# Patient Record
Sex: Female | Born: 1955 | Race: Black or African American | Hispanic: No | Marital: Single | Smoking: Never smoker
Health system: Southern US, Community
[De-identification: ages and names within clinical notes are randomized; demographics above are authoritative.]

## PROBLEM LIST (undated history)

## (undated) DIAGNOSIS — Z87442 Personal history of urinary calculi: Secondary | ICD-10-CM

## (undated) DIAGNOSIS — H269 Unspecified cataract: Secondary | ICD-10-CM

## (undated) DIAGNOSIS — D649 Anemia, unspecified: Secondary | ICD-10-CM

## (undated) DIAGNOSIS — K219 Gastro-esophageal reflux disease without esophagitis: Secondary | ICD-10-CM

## (undated) DIAGNOSIS — R519 Headache, unspecified: Secondary | ICD-10-CM

## (undated) DIAGNOSIS — R51 Headache: Secondary | ICD-10-CM

## (undated) DIAGNOSIS — I209 Angina pectoris, unspecified: Secondary | ICD-10-CM

## (undated) DIAGNOSIS — R0600 Dyspnea, unspecified: Secondary | ICD-10-CM

## (undated) HISTORY — PX: WISDOM TOOTH EXTRACTION: SHX21

## (undated) HISTORY — PX: ABDOMINAL HYSTERECTOMY: SHX81

## (undated) HISTORY — PX: COLPOSCOPY: SHX161

## (undated) HISTORY — PX: CHOLECYSTECTOMY: SHX55

## (undated) HISTORY — DX: Unspecified cataract: H26.9

## (undated) HISTORY — PX: BREAST EXCISIONAL BIOPSY: SUR124

---

## 1998-10-10 ENCOUNTER — Other Ambulatory Visit: Admission: RE | Admit: 1998-10-10 | Discharge: 1998-10-10 | Payer: Self-pay | Admitting: Gynecology

## 1999-10-01 ENCOUNTER — Encounter: Admission: RE | Admit: 1999-10-01 | Discharge: 1999-10-01 | Payer: Self-pay | Admitting: Gynecology

## 1999-10-01 ENCOUNTER — Encounter: Payer: Self-pay | Admitting: Gynecology

## 1999-10-13 ENCOUNTER — Other Ambulatory Visit: Admission: RE | Admit: 1999-10-13 | Discharge: 1999-10-13 | Payer: Self-pay | Admitting: Gynecology

## 1999-10-29 ENCOUNTER — Encounter: Admission: RE | Admit: 1999-10-29 | Discharge: 1999-10-29 | Payer: Self-pay | Admitting: Geriatric Medicine

## 1999-10-29 ENCOUNTER — Encounter: Payer: Self-pay | Admitting: Geriatric Medicine

## 2000-03-02 ENCOUNTER — Ambulatory Visit (HOSPITAL_COMMUNITY): Admission: RE | Admit: 2000-03-02 | Discharge: 2000-03-02 | Payer: Self-pay | Admitting: Gastroenterology

## 2000-10-03 ENCOUNTER — Encounter: Payer: Self-pay | Admitting: Gynecology

## 2000-10-03 ENCOUNTER — Other Ambulatory Visit: Admission: RE | Admit: 2000-10-03 | Discharge: 2000-10-03 | Payer: Self-pay | Admitting: Gynecology

## 2000-10-03 ENCOUNTER — Encounter: Admission: RE | Admit: 2000-10-03 | Discharge: 2000-10-03 | Payer: Self-pay | Admitting: Gynecology

## 2001-08-25 ENCOUNTER — Encounter: Admission: RE | Admit: 2001-08-25 | Discharge: 2001-08-25 | Payer: Self-pay | Admitting: Geriatric Medicine

## 2001-08-25 ENCOUNTER — Encounter: Payer: Self-pay | Admitting: Geriatric Medicine

## 2001-10-04 ENCOUNTER — Encounter: Payer: Self-pay | Admitting: Gynecology

## 2001-10-04 ENCOUNTER — Encounter: Admission: RE | Admit: 2001-10-04 | Discharge: 2001-10-04 | Payer: Self-pay | Admitting: Gynecology

## 2001-10-10 ENCOUNTER — Other Ambulatory Visit: Admission: RE | Admit: 2001-10-10 | Discharge: 2001-10-10 | Payer: Self-pay | Admitting: Gynecology

## 2002-06-13 ENCOUNTER — Encounter: Admission: RE | Admit: 2002-06-13 | Discharge: 2002-06-13 | Payer: Self-pay | Admitting: Geriatric Medicine

## 2002-06-13 ENCOUNTER — Encounter: Payer: Self-pay | Admitting: Geriatric Medicine

## 2002-10-08 ENCOUNTER — Encounter: Payer: Self-pay | Admitting: Gynecology

## 2002-10-08 ENCOUNTER — Encounter: Admission: RE | Admit: 2002-10-08 | Discharge: 2002-10-08 | Payer: Self-pay | Admitting: Gynecology

## 2002-11-05 ENCOUNTER — Other Ambulatory Visit: Admission: RE | Admit: 2002-11-05 | Discharge: 2002-11-05 | Payer: Self-pay | Admitting: Gynecology

## 2003-10-09 ENCOUNTER — Encounter: Admission: RE | Admit: 2003-10-09 | Discharge: 2003-10-09 | Payer: Self-pay | Admitting: Gynecology

## 2003-11-08 ENCOUNTER — Other Ambulatory Visit: Admission: RE | Admit: 2003-11-08 | Discharge: 2003-11-08 | Payer: Self-pay | Admitting: Gynecology

## 2004-10-16 ENCOUNTER — Encounter: Admission: RE | Admit: 2004-10-16 | Discharge: 2004-10-16 | Payer: Self-pay | Admitting: Gynecology

## 2004-10-26 ENCOUNTER — Encounter: Admission: RE | Admit: 2004-10-26 | Discharge: 2004-10-26 | Payer: Self-pay | Admitting: Gynecology

## 2004-11-09 ENCOUNTER — Other Ambulatory Visit: Admission: RE | Admit: 2004-11-09 | Discharge: 2004-11-09 | Payer: Self-pay | Admitting: Gynecology

## 2005-02-11 ENCOUNTER — Ambulatory Visit (HOSPITAL_COMMUNITY): Admission: RE | Admit: 2005-02-11 | Discharge: 2005-02-11 | Payer: Self-pay | Admitting: General Surgery

## 2005-03-15 ENCOUNTER — Ambulatory Visit (HOSPITAL_COMMUNITY): Admission: RE | Admit: 2005-03-15 | Discharge: 2005-03-15 | Payer: Self-pay | Admitting: Gastroenterology

## 2005-06-29 ENCOUNTER — Ambulatory Visit (HOSPITAL_COMMUNITY): Admission: RE | Admit: 2005-06-29 | Discharge: 2005-06-29 | Payer: Self-pay | Admitting: Gastroenterology

## 2005-08-13 ENCOUNTER — Encounter (INDEPENDENT_AMBULATORY_CARE_PROVIDER_SITE_OTHER): Payer: Self-pay | Admitting: *Deleted

## 2005-08-13 ENCOUNTER — Ambulatory Visit (HOSPITAL_COMMUNITY): Admission: RE | Admit: 2005-08-13 | Discharge: 2005-08-14 | Payer: Self-pay | Admitting: General Surgery

## 2005-11-03 ENCOUNTER — Encounter: Admission: RE | Admit: 2005-11-03 | Discharge: 2005-11-03 | Payer: Self-pay | Admitting: Gynecology

## 2005-11-24 ENCOUNTER — Other Ambulatory Visit: Admission: RE | Admit: 2005-11-24 | Discharge: 2005-11-24 | Payer: Self-pay | Admitting: Gynecology

## 2005-11-25 ENCOUNTER — Emergency Department (HOSPITAL_COMMUNITY): Admission: EM | Admit: 2005-11-25 | Discharge: 2005-11-25 | Payer: Self-pay | Admitting: Emergency Medicine

## 2006-01-27 ENCOUNTER — Encounter: Admission: RE | Admit: 2006-01-27 | Discharge: 2006-01-27 | Payer: Self-pay | Admitting: Geriatric Medicine

## 2006-11-07 ENCOUNTER — Encounter: Admission: RE | Admit: 2006-11-07 | Discharge: 2006-11-07 | Payer: Self-pay | Admitting: Gynecology

## 2006-11-28 ENCOUNTER — Other Ambulatory Visit: Admission: RE | Admit: 2006-11-28 | Discharge: 2006-11-28 | Payer: Self-pay | Admitting: Gynecology

## 2007-11-29 ENCOUNTER — Encounter: Admission: RE | Admit: 2007-11-29 | Discharge: 2007-11-29 | Payer: Self-pay | Admitting: Gynecology

## 2007-12-07 ENCOUNTER — Encounter: Admission: RE | Admit: 2007-12-07 | Discharge: 2007-12-07 | Payer: Self-pay | Admitting: Gynecology

## 2008-11-29 ENCOUNTER — Encounter: Admission: RE | Admit: 2008-11-29 | Discharge: 2008-11-29 | Payer: Self-pay | Admitting: Gynecology

## 2009-12-02 ENCOUNTER — Encounter: Admission: RE | Admit: 2009-12-02 | Discharge: 2009-12-02 | Payer: Self-pay | Admitting: Gynecology

## 2010-10-02 NOTE — Op Note (Signed)
[name], [name]            ACCOUNT NO.:  [id number]   MEDICAL RECORD NO.:  [id number]          PATIENT TYPE:  AMB   LOCATION:  ENDO                         FACILITY:  [facility]   PHYSICIAN:  James L. Malon Kindle., M.D.DATE OF BIRTH:  06-19-1955   DATE OF PROCEDURE:  03/15/2005  DATE OF DISCHARGE:                                 OPERATIVE REPORT   PROCEDURE:  Esophagogastroduodenoscopy.   MEDICATIONS:  Cetacaine spray, fentanyl , Versed 6 mg IV.   INDICATIONS:  The patient is a 55 year old with abdominal pain __________  nature.  Recent gallbladder ultrasound was negative.   DESCRIPTION OF PROCEDURE:  After the procedure had been explained to the  patient, consent obtained.  The patient was in the left lateral decubitus  position.  The Olympus scope was inserted and advanced.  Stomach entered,  pylorus identified and passed.  Duodenum including the bulb and second  portion was unremarkable.  Pyloric channel and antrum and body were normal.  Fundus and cardia were seen on the retroflexed view and were normal.  The GE  junction was widely patent.  The distal esophagus was slightly reddened  consistent with mild esophageal reflux.  There were no ulcerations in the  distal and proximal esophagus, were endoscopically normal.  The scope was  withdrawn.  The patient tolerated the procedure well.   ASSESSMENT:  Gastroesophageal reflux disease.  530.81.  Possibly the source  of her symptoms.   PLAN:  Will continue her on Aciphex.  Give her reflux instructions.  She  will be seen back in the office in three months.  Will proceed with  colonoscopy at this time as planned.           ______________________________  Llana Aliment. Malon Kindle., M.D.     Waldron Session  D:  03/15/2005  T:  03/15/2005  Job:  [id number]   cc:   Hal T. Stoneking, M.D.  Fax: [phone]   Ollen Gross. Vernell Morgans, M.D.  [address]  [place]  [place] [address]

## 2010-10-02 NOTE — Op Note (Signed)
[name], [name]            ACCOUNT NO.:  [id number]   MEDICAL RECORD NO.:  [id number]          PATIENT TYPE:  OIB   LOCATION:  5009                         FACILITY:  [facility]   PHYSICIAN:  Ollen Gross. Vernell Morgans, M.D. DATE OF BIRTH:  09-Feb-1956   DATE OF PROCEDURE:  08/18/2005  DATE OF DISCHARGE:  08/14/2005                                 OPERATIVE REPORT   PREOPERATIVE DIAGNOSIS:  Biliary dyskinesia.   POSTOP DIAGNOSIS:  Biliary dyskinesia.   PROCEDURES:  Laparoscopic cholecystectomy with intraoperative cholangiogram.   SURGEON:  Dr. Carolynne Edouard.   ASSISTANT:  Dr. Jamey Ripa.   ANESTHESIA:  General endotracheal.   PROCEDURE:  After informed consent was obtained, the patient was brought to  the operating room and placed in supine position on the operating table.  After induction of general anesthesia, the patient's abdomen was prepped  with Betadine and draped in usual sterile manner.  The area below the  umbilicus was infiltrated with 4% Marcaine.  A small incision was made with  a 15 blade knife.  This incision was carried down through the subcutaneous  tissue bluntly with a hemostat and [employer] retractors until the linea alba  was identified.  The linea alba was incised with a 15 blade knife and each  side was grasped with Kocher clamps and elevated anteriorly.  The  preperitoneal space was then probed bluntly with a hemostat until the  peritoneum was opened and access was gained to the abdominal cavity.  0  Vicryl pursestring stitch was placed in the fascia around the opening.  Hasson cannula was placed through the opening and anchored in place with the  previously placed Vicryl pursestring stitch.  The abdomen was then  insufflated carbon dioxide without difficulty.  The patient was placed in  head-up position and rotated slightly with right side up.  The laparoscope  was inserted through Hasson cannula and the right upper quadrant was  inspected.  The dome of gallbladder and  liver readily identified.  Next the  epigastric region was infiltrated with 0.25% Marcaine.  A small incision was  made a 15 blade knife and 10 mm port was placed bluntly through this  incision into the abdominal cavity under direct vision.  Sites were then  chosen laterally on the right side of the abdomen for placement of 5 mm  ports.  Each of these areas was infiltrated 0.25% Marcaine.  Small stab  incisions were made with a 15 blade knife and 5 mm ports were placed bluntly  through these incisions into the abdominal cavity under direct vision.  A  blunt grasper was placed through the lateral-most 5 mm port and used to  grasp the dome of gallbladder and elevate it anteriorly and superiorly.  Another blunt grasper was placed through the other 5 mm port and used to  retract on the body and neck of the gallbladder. There were some omental  adhesions to the body of gallbladder and these were taken down by  combination of blunt dissection with the dissector and sharp dissection with  the laparoscopic scissors.  Once this was accomplished, the full length  of  the gallbladder was able to be visualized.  The peritoneal reflection was  then opened at the gallbladder neck using electrocautery and blunt  dissection was then carried out in this area until the gallbladder neck  cystic duct junction was readily identified.  A good window was created.  A  single clip was placed on the gallbladder neck.  The small ductotomy was  made just below the clip.  A 14 gauge Angiocath was then placed  percutaneously through the anterior abdominal wall under direct vision.  A  Reddick cholangiogram catheter placed through the Angiocath and flushed.  The Reddick catheter was then placed within the cystic duct and anchored in  place with the clip and cholangiogram was obtained that showed no filling  defects, good emptying in the duodenum and adequate length on the cystic  duct.  Anchoring clip and catheters  removed from the patient.  Three clips  placed proximally on the cystic duct and duct was divided between the two  sets of clips.  Posterior to this the cystic artery was identified and again  dissected bluntly in a circumferential manner until a good window was  created.  Two clips placed proximally and one distally on the artery and the  artery was divided between the two sets of clips.  Next a laparoscopic hook  cautery device was used to separate the gallbladder from the liver bed.  Prior to completely detaching the gallbladder from liver bed, the liver bed  was inspected and several small bleeding points coagulated with  electrocautery until the area was completely hemostatic, the gallbladder was  then detached the rest of the way from the liver bed without difficulty.  Laparoscopic bag was placed through the epigastric port.  The gallbladder  placed within the bag and the bag was sealed and the abdomen was then  irrigated with copious amounts of saline until the effluent was clear.  The  liver bed was inspected again and found to be completely hemostatic.  The  laparoscope was then moved to the epigastric port.  The gallbladder grasper  was placed through the Hasson cannula and used to grasp the opening of the  bag.  The bag with the gallbladder was then removed through the  infraumbilical port without difficulty with the Hasson cannula.  The fascial  defect was closed with the previously placed Vicryl pursestring stitch as  well as was another interrupted figure-of-eight 0 Vicryl stitch. The rest of  the ports were then removed under direct vision and were found to be  hemostatic.  Gas was allowed to escape.  The skin incisions were all closed  with interrupted 4-0 Monocryl subcuticular stitches.  Benzoin, Steri-Strips  and sterile dressings were applied.  The patient tolerated well.  At the end  of the case, all needle, sponge and instruments counts correct.  The patient was then  awakened and taken to recovery in stable condition.      Ollen Gross. Vernell Morgans, M.D.  Electronically Signed     PST/MEDQ  D:  08/18/2005  T:  08/19/2005  Job:  [id number]

## 2010-10-02 NOTE — Op Note (Signed)
[name], [name]            ACCOUNT NO.:  [id number]   MEDICAL RECORD NO.:  [id number]          PATIENT TYPE:  AMB   LOCATION:  ENDO                         FACILITY:  [facility]   PHYSICIAN:  James L. Malon Kindle., M.D.DATE OF BIRTH:  1956/04/02   DATE OF PROCEDURE:  03/15/2005  DATE OF DISCHARGE:                                 OPERATIVE REPORT   PROCEDURE:  Colonoscopy.   MEDICATIONS:  The patient received a total of fentanyl 100 mcg, Versed 8 mg  for both procedures.   SCOPE:  Pediatric adjustable scope.   INDICATIONS FOR PROCEDURE:  Strong family history of colonic neoplasia.  Father has had multiple polyps and multiple other family members have had  polyps. This is done as a followup.   DESCRIPTION OF PROCEDURE:  The procedure had been explained to the patient,  consent obtained. In the left lateral decubitus position, the Olympus video  colonoscope was inserted, the pediatric adjustable scope was used. The prep  was excellent. We were able to reach the cecum without difficulty. The  ileocecal valve and appendiceal orifice were seen. The scope was withdrawn  and the colon carefully examined on withdrawal. No polyps were seen. There  was no diverticulosis. In the rectum, the patient was seen to have moderate  internal hemorrhoids. The scope was withdrawn. The patient tolerated the  procedure well.   ASSESSMENT:  1.  Strong family history of colonic neoplasia with colonic polyps, negative      colonoscopy at this time. V18.5.  2.  Internal hemorrhoids. 455.0   PLAN:  Will recommend Hemoccult's, repeat colonoscopy in 5 years.           ______________________________  Llana Aliment Malon Kindle., M.D.     Waldron Session  D:  03/15/2005  T:  03/15/2005  Job:  [id number]   cc:   Hal T. Stoneking, M.D.  Fax: [phone]   Ollen Gross. Vernell Morgans, M.D.  [address]  [place]  [place] [address]

## 2010-11-05 ENCOUNTER — Other Ambulatory Visit: Payer: Self-pay | Admitting: Gynecology

## 2010-11-05 DIAGNOSIS — N63 Unspecified lump in unspecified breast: Secondary | ICD-10-CM

## 2010-11-11 ENCOUNTER — Other Ambulatory Visit: Payer: Self-pay

## 2010-12-04 ENCOUNTER — Other Ambulatory Visit: Payer: Self-pay

## 2010-12-09 ENCOUNTER — Other Ambulatory Visit: Payer: Self-pay | Admitting: Gynecology

## 2010-12-09 ENCOUNTER — Ambulatory Visit
Admission: RE | Admit: 2010-12-09 | Discharge: 2010-12-09 | Disposition: A | Discharge status: Home / Self Care | Payer: 59 | Source: Ambulatory Visit | Attending: Gynecology | Admitting: Gynecology

## 2010-12-09 DIAGNOSIS — N63 Unspecified lump in unspecified breast: Secondary | ICD-10-CM

## 2010-12-15 ENCOUNTER — Other Ambulatory Visit: Payer: Self-pay | Admitting: Gynecology

## 2011-12-24 ENCOUNTER — Other Ambulatory Visit: Payer: Self-pay | Admitting: Gynecology

## 2011-12-24 DIAGNOSIS — Z1231 Encounter for screening mammogram for malignant neoplasm of breast: Secondary | ICD-10-CM

## 2012-01-11 ENCOUNTER — Ambulatory Visit
Admission: RE | Admit: 2012-01-11 | Discharge: 2012-01-11 | Disposition: A | Discharge status: Home / Self Care | Payer: Self-pay | Source: Ambulatory Visit | Attending: Gynecology | Admitting: Gynecology

## 2012-01-11 DIAGNOSIS — Z1231 Encounter for screening mammogram for malignant neoplasm of breast: Secondary | ICD-10-CM

## 2012-04-18 ENCOUNTER — Other Ambulatory Visit: Payer: Self-pay | Admitting: Gynecology

## 2013-06-14 ENCOUNTER — Other Ambulatory Visit (HOSPITAL_COMMUNITY): Payer: Self-pay | Admitting: Gynecology

## 2013-06-14 DIAGNOSIS — Z1231 Encounter for screening mammogram for malignant neoplasm of breast: Secondary | ICD-10-CM

## 2013-06-19 ENCOUNTER — Other Ambulatory Visit (HOSPITAL_COMMUNITY): Payer: Self-pay | Admitting: Gynecology

## 2013-06-19 ENCOUNTER — Ambulatory Visit (HOSPITAL_COMMUNITY)
Admission: RE | Admit: 2013-06-19 | Discharge: 2013-06-19 | Disposition: A | Discharge status: Home / Self Care | Payer: Self-pay | Source: Ambulatory Visit | Attending: Gynecology | Admitting: Gynecology

## 2013-06-19 DIAGNOSIS — Z1231 Encounter for screening mammogram for malignant neoplasm of breast: Secondary | ICD-10-CM

## 2016-07-01 ENCOUNTER — Other Ambulatory Visit: Payer: Self-pay | Admitting: Obstetrics and Gynecology

## 2016-07-01 DIAGNOSIS — Z1231 Encounter for screening mammogram for malignant neoplasm of breast: Secondary | ICD-10-CM

## 2016-07-15 ENCOUNTER — Encounter (HOSPITAL_COMMUNITY): Payer: Self-pay

## 2016-07-15 ENCOUNTER — Ambulatory Visit (HOSPITAL_COMMUNITY)
Admission: RE | Admit: 2016-07-15 | Discharge: 2016-07-15 | Disposition: A | Discharge status: Home / Self Care | Payer: Self-pay | Source: Ambulatory Visit | Attending: Obstetrics and Gynecology | Admitting: Obstetrics and Gynecology

## 2016-07-15 ENCOUNTER — Ambulatory Visit
Admission: RE | Admit: 2016-07-15 | Discharge: 2016-07-15 | Disposition: A | Discharge status: Home / Self Care | Payer: No Typology Code available for payment source | Source: Ambulatory Visit | Attending: Obstetrics and Gynecology | Admitting: Obstetrics and Gynecology

## 2016-07-15 ENCOUNTER — Other Ambulatory Visit (HOSPITAL_COMMUNITY): Payer: Self-pay | Admitting: *Deleted

## 2016-07-15 VITALS — BP 130/80 | Ht 63.5 in | Wt 166.2 lb

## 2016-07-15 DIAGNOSIS — Z1239 Encounter for other screening for malignant neoplasm of breast: Secondary | ICD-10-CM

## 2016-07-15 DIAGNOSIS — Z1231 Encounter for screening mammogram for malignant neoplasm of breast: Secondary | ICD-10-CM

## 2016-07-15 DIAGNOSIS — R928 Other abnormal and inconclusive findings on diagnostic imaging of breast: Secondary | ICD-10-CM

## 2016-07-15 HISTORY — DX: Anemia, unspecified: D64.9

## 2016-07-15 NOTE — Patient Instructions (Signed)
Explained breast self awareness with Trey Paula. Patient did not need a Pap smear today due to last Pap smear due to her history of a hysterectomy for benign reasons. Let patient know that she doesn't need any further Pap smears due to her history of a hysterectomy for benign reasons. Referred patient to the [facility] for a left diagnostic mammogram and possible breast ultrasound per recommendation. Appointment scheduled for Thursday, July 15, 2016 at 1250. Trey Paula verbalized understanding.  [place]on Pam, Arvil Chaco, RN 12:30 PM

## 2016-07-15 NOTE — Progress Notes (Signed)
Patient referred to [facility] due to having a screening mammogram completed 01/01/2016 that additional imaging of the left breast is recommended. Screening mammogram completed on the [facility] mobile mammography program offered by [facility].  Pap Smear: Pap smear not completed today. Last Pap smear was 04/18/2012 at Dr. Josie Dixon office and normal. Per patient has a history of an abnormal Pap smear in the early 1990's that a colposcopy was completed for follow up. Patient has a history of a hysterectomy in 1998 for ovarian cysts. Patient no longer needs Pap smears due to her history of a hysterectomy for benign reasons per BCCCP and ACOG guidelines. Last Pap smear result is in EPIC.  Physical exam: Breasts Breasts symmetrical. No skin abnormalities bilateral breasts. No nipple retraction bilateral breasts. No nipple discharge bilateral breasts. No lymphadenopathy. No lumps palpated bilateral breasts. No complaints of pain or tenderness on exam. Referred patient to the [facility] for a left diagnostic mammogram and possible breast ultrasound per recommendation. Appointment scheduled for Thursday, July 15, 2016 at 1250.        Pelvic/Bimanual No Pap smear completed today since patient has a history of a hysterectomy for benign reasons. Pap smear not indicated per BCCCP guidelines.   Smoking History: Patient has never smoked.  Patient Navigation: Patient education provided. Access to services provided for patient through [facility] program.   Colorectal Cancer Screening: Per patient had a colonoscopy completed in May 2016. No complaints today. FIT Test given to patient to complete and return to BCCCP.

## 2016-07-16 ENCOUNTER — Encounter (HOSPITAL_COMMUNITY): Payer: Self-pay | Admitting: *Deleted

## 2016-07-19 ENCOUNTER — Other Ambulatory Visit: Payer: Self-pay

## 2016-07-30 LAB — FECAL OCCULT BLOOD, IMMUNOCHEMICAL: Fecal Occult Bld: NEGATIVE

## 2016-08-18 ENCOUNTER — Telehealth (HOSPITAL_COMMUNITY): Payer: Self-pay | Admitting: *Deleted

## 2016-08-18 NOTE — Telephone Encounter (Signed)
Telephoned patient at home number and advised patient of negative Fit Test. Patient voiced understanding.  

## 2017-01-19 ENCOUNTER — Telehealth (HOSPITAL_COMMUNITY): Payer: Self-pay | Admitting: *Deleted

## 2017-01-19 ENCOUNTER — Other Ambulatory Visit: Payer: Self-pay | Admitting: Obstetrics and Gynecology

## 2017-01-19 DIAGNOSIS — Z1231 Encounter for screening mammogram for malignant neoplasm of breast: Secondary | ICD-10-CM

## 2017-01-25 ENCOUNTER — Ambulatory Visit
Admission: RE | Admit: 2017-01-25 | Discharge: 2017-01-25 | Disposition: A | Discharge status: Home / Self Care | Payer: No Typology Code available for payment source | Source: Ambulatory Visit | Attending: Obstetrics and Gynecology | Admitting: Obstetrics and Gynecology

## 2017-01-25 DIAGNOSIS — Z1231 Encounter for screening mammogram for malignant neoplasm of breast: Secondary | ICD-10-CM

## 2017-02-14 NOTE — Telephone Encounter (Signed)
Reminder call

## 2017-09-05 ENCOUNTER — Other Ambulatory Visit: Payer: Self-pay | Admitting: Geriatric Medicine

## 2017-09-05 DIAGNOSIS — R109 Unspecified abdominal pain: Secondary | ICD-10-CM

## 2017-09-08 ENCOUNTER — Other Ambulatory Visit: Payer: Self-pay

## 2017-09-19 ENCOUNTER — Other Ambulatory Visit: Payer: Self-pay | Admitting: General Surgery

## 2017-09-23 NOTE — Pre-Procedure Instructions (Signed)
[name]  09/23/2017      [facility] - [address] AT [facility] [address] [place] [address] Phone: [phone] Fax: [phone]  [facility] [phone] - [place], [place] - [address] AT [address] [place] [address] [place] [address] Phone: [phone] Fax: [phone]    Your procedure is scheduled on Tues. May 14  Report to [facility] Admitting at 6:30 A.M.  Call this number if you have problems the morning of surgery:  [phone]   Remember:  Do not eat food or drink liquids after midnight.   Take these medicines the morning of surgery with A SIP OF WATER : tylenol if needed, omeprazole (prilosec), eye drops               Drink bottle of ensure before you leave the house the day of surgery.             7 days prior to surgery STOP taking any Aspirin(unless otherwise instructed by your surgeon), Aleve, Naproxen, Ibuprofen, Motrin, Advil, Goody's, BC's, all herbal medications, fish oil, and all vitamins   Do not wear jewelry, make-up or nail polish.  Do not wear lotions, powders, or perfumes, or deodorant.  Do not shave 48 hours prior to surgery.  Men may shave face and neck.  Do not bring valuables to the hospital.  [facility] is not responsible for any belongings or valuables.  Contacts, dentures or bridgework may not be worn into surgery.  Leave your suitcase in the car.  After surgery it may be brought to your room.  For patients admitted to the hospital, discharge time will be determined by your treatment team.  Patients discharged the day of surgery will not be allowed to drive home.    Special instructions:  [facility]- Preparing For Surgery  Before surgery, you can play an important role. Because skin is not sterile, your skin needs to be as free of germs as possible. You can reduce the number of germs on your skin  by washing with CHG (chlorahexidine gluconate) Soap before surgery.  CHG is an antiseptic cleaner which kills germs and bonds with the skin to continue killing germs even after washing.  Oral Hygiene is also important to reduce your risk of infection.  Remember - BRUSH YOUR TEETH THE MORNING OF SURGERY  Please do not use if you have an allergy to CHG or antibacterial soaps. If your skin becomes reddened/irritated stop using the CHG.  Do not shave (including legs and underarms) for at least 48 hours prior to first CHG shower. It is OK to shave your face.  Please follow these instructions carefully.   1. Shower the NIGHT BEFORE SURGERY and the MORNING OF SURGERY with CHG.   2. If you chose to wash your hair, wash your hair first as usual with your normal shampoo.  3. After you shampoo, rinse your hair and body thoroughly to remove the shampoo.  4. Use CHG as you would any other liquid soap. You can apply CHG directly to the skin and wash gently with a scrungie or a clean washcloth.   5. Apply the CHG Soap to your body ONLY FROM THE NECK DOWN.  Do not use on open wounds or open sores. Avoid contact with your eyes, ears, mouth and genitals (private parts). Wash Face and genitals (private parts)  with  your normal soap.  6. Wash thoroughly, paying special attention to the area where your surgery will be performed.  7. Thoroughly rinse your body with warm water from the neck down.  8. DO NOT shower/wash with your normal soap after using and rinsing off the CHG Soap.  9. Pat yourself dry with a CLEAN TOWEL.  10. Wear CLEAN PAJAMAS to bed the night before surgery, wear comfortable clothes the morning of surgery  11. Place CLEAN SHEETS on your bed the night of your first shower and DO NOT SLEEP WITH PETS.    Day of Surgery: Do not apply any deodorants/lotions. Please wear clean clothes to the hospital/surgery center.  Remember to brush your teeth.      Please read over the following fact  sheets that you were given. Coughing and Deep Breathing and Surgical Site Infection Prevention

## 2017-09-26 ENCOUNTER — Encounter (HOSPITAL_COMMUNITY): Payer: Self-pay

## 2017-09-26 ENCOUNTER — Encounter (HOSPITAL_COMMUNITY)
Admission: RE | Admit: 2017-09-26 | Discharge: 2017-09-26 | Disposition: A | Discharge status: Home / Self Care | Payer: Self-pay | Source: Ambulatory Visit | Attending: General Surgery | Admitting: General Surgery

## 2017-09-26 ENCOUNTER — Other Ambulatory Visit: Payer: Self-pay

## 2017-09-26 DIAGNOSIS — Z01812 Encounter for preprocedural laboratory examination: Secondary | ICD-10-CM | POA: Insufficient documentation

## 2017-09-26 DIAGNOSIS — K7689 Other specified diseases of liver: Secondary | ICD-10-CM | POA: Insufficient documentation

## 2017-09-26 DIAGNOSIS — Z01810 Encounter for preprocedural cardiovascular examination: Secondary | ICD-10-CM | POA: Insufficient documentation

## 2017-09-26 HISTORY — DX: Headache: R51

## 2017-09-26 HISTORY — DX: Angina pectoris, unspecified: I20.9

## 2017-09-26 HISTORY — DX: Dyspnea, unspecified: R06.00

## 2017-09-26 HISTORY — DX: Headache, unspecified: R51.9

## 2017-09-26 HISTORY — DX: Personal history of urinary calculi: Z87.442

## 2017-09-26 HISTORY — DX: Gastro-esophageal reflux disease without esophagitis: K21.9

## 2017-09-26 LAB — CBC WITH DIFFERENTIAL/PLATELET
BASOS ABS: 0 10*3/uL (ref 0.0–0.1)
Basophils Relative: 1 %
EOS ABS: 0.1 10*3/uL (ref 0.0–0.7)
EOS PCT: 2 %
HCT: 35.8 % — ABNORMAL LOW (ref 36.0–46.0)
HEMOGLOBIN: 11.2 g/dL — AB (ref 12.0–15.0)
LYMPHS PCT: 39 %
Lymphs Abs: 1.3 10*3/uL (ref 0.7–4.0)
MCH: 25.8 pg — ABNORMAL LOW (ref 26.0–34.0)
MCHC: 31.3 g/dL (ref 30.0–36.0)
MCV: 82.5 fL (ref 78.0–100.0)
Monocytes Absolute: 0.2 10*3/uL (ref 0.1–1.0)
Monocytes Relative: 7 %
NEUTROS PCT: 51 %
Neutro Abs: 1.7 10*3/uL (ref 1.7–7.7)
PLATELETS: 270 10*3/uL (ref 150–400)
RBC: 4.34 MIL/uL (ref 3.87–5.11)
RDW: 15.1 % (ref 11.5–15.5)
WBC: 3.3 10*3/uL — AB (ref 4.0–10.5)

## 2017-09-26 LAB — COMPREHENSIVE METABOLIC PANEL
ALT: 23 U/L (ref 14–54)
AST: 23 U/L (ref 15–41)
Albumin: 3.7 g/dL (ref 3.5–5.0)
Alkaline Phosphatase: 64 U/L (ref 38–126)
Anion gap: 8 (ref 5–15)
BUN: 8 mg/dL (ref 6–20)
CALCIUM: 9.7 mg/dL (ref 8.9–10.3)
CHLORIDE: 105 mmol/L (ref 101–111)
CO2: 26 mmol/L (ref 22–32)
Creatinine, Ser: 1.04 mg/dL — ABNORMAL HIGH (ref 0.44–1.00)
GFR calc Af Amer: >60 mL/min (ref >60)
GFR calc non Af Amer: 57 mL/min — ABNORMAL LOW (ref >60)
Glucose, Bld: 91 mg/dL (ref 65–99)
Potassium: 3.9 mmol/L (ref 3.5–5.1)
SODIUM: 139 mmol/L (ref 135–145)
TOTAL PROTEIN: 7.4 g/dL (ref 6.5–8.1)
Total Bilirubin: 0.3 mg/dL (ref 0.3–1.2)

## 2017-09-26 LAB — PROTIME-INR
INR: 1.04
PROTHROMBIN TIME: 13.5 s (ref 11.4–15.2)

## 2017-09-26 LAB — ABO/RH: ABO/RH(D): B POS

## 2017-09-26 MED ORDER — CEFAZOLIN SODIUM-DEXTROSE 2-4 GM/100ML-% IV SOLN
2.0000 g | INTRAVENOUS | Status: AC
  Administered 2017-09-27: 2 g via INTRAVENOUS
  Filled 2017-09-26: qty 100

## 2017-09-26 NOTE — Anesthesia Preprocedure Evaluation (Addendum)
Anesthesia Evaluation  Patient identified by MRN, date of birth, ID band Patient awake    Reviewed: Allergy & Precautions, H&P , NPO status , Patient's Chart, lab work & pertinent test results  Airway Mallampati: III  TM Distance: >3 FB Neck ROM: Full    Dental no notable dental hx. (+) Teeth Intact, Dental Advisory Given   Pulmonary neg pulmonary ROS,    Pulmonary exam normal breath sounds clear to auscultation       Cardiovascular Exercise Tolerance: Good negative cardio ROS   Rhythm:Regular Rate:Normal     Neuro/Psych  Headaches, negative psych ROS   GI/Hepatic Neg liver ROS, GERD  Medicated and Controlled,  Endo/Other  negative endocrine ROS  Renal/GU negative Renal ROS  negative genitourinary   Musculoskeletal   Abdominal   Peds  Hematology negative hematology ROS (+) anemia ,   Anesthesia Other Findings   Reproductive/Obstetrics negative OB ROS                            Anesthesia Physical Anesthesia Plan  ASA: II  Anesthesia Plan: General   Post-op Pain Management:    Induction: Intravenous  PONV Risk Score and Plan: 4 or greater and Ondansetron, Dexamethasone and Midazolam  Airway Management Planned: Oral ETT  Additional Equipment:   Intra-op Plan:   Post-operative Plan: Extubation in OR  Informed Consent: I have reviewed the patients History and Physical, chart, labs and discussed the procedure including the risks, benefits and alternatives for the proposed anesthesia with the patient or authorized representative who has indicated his/her understanding and acceptance.   Dental advisory given  Plan Discussed with: CRNA  Anesthesia Plan Comments:         Anesthesia Quick Evaluation

## 2017-09-26 NOTE — Progress Notes (Signed)
PCP: Dr. Felipa Eth  No cardiologist

## 2017-09-26 NOTE — Progress Notes (Signed)
Anesthesia PAT Evaluation:   Case:  [id number] Date/Time:  09/27/17 0815   Procedure:  LAPAROSCOPIC LIVER CYST UNROOFING (N/A )   Anesthesia type:  General   Pre-op diagnosis:  LIVER CYST   Location:  [facility] OR ROOM 08 / [place] OR   Surgeon:  [name], MD      DISCUSSION: Patient is a 62 year old female scheduled for the above procedure. She was recently found to have a large liver cyst.  She noticed more exertional SOB a few months ago about the same time when she developed right sided abdominal pain. She was unable to sleep comfortably at night due to the abdominal pain which prompted a CT which reportedly showed a large liver cyst pressing up against her diaphragm. She was reportedly told this was likely the source of her dyspnea. Her PCP Dr. Felipa Eth referred her to general surgery.  She has not had any new or significant edema or recent chest pain (saw her PCP Dr. Felipa Eth over a year ago for chest pain and was told symptoms were related to costochondritis). There is no exertional chest pain. Her dyspnea is not so severe that she has to stop her activities and recovers quickly. She has been helping take care of a family member and is able to do her own housework, laundry, etc. Recently, she has had to sleep in a recliner or on her left side due to abdominal pain. She is not a smoker or diabetic. There is no known personal history of CAD, dysrhythmia, or CHF. There is a family history of HTN, but no known CAD. Today's EKG showed NSR without ST/T wave abnormality.   Currently, I do not have access to Dr. Barry Dienes office note or the CT scan report done through [facility]. Patient is a former Marine scientist at [employer]. As above, she reported some exertional dyspnea over the past few months that has been attributed to the liver cyst pressing up to her diaphragm. She denied chest pain, edema. No findings concerning for CHF on exam today. EKG normal. No smoking or diabetes. Based on currently available information, I  would anticipate that she can proceed with surgery as planned.  VS: BP (!) 144/85   Pulse 100   Temp 36.7 C   Resp 20   Ht 5' 3.5" (1.613 m)   Wt 168 lb 4.8 oz (76.3 kg)   SpO2 98%   BMI 29.35 kg/m  She is a pleasant black female in NAD. No conversational dyspnea. Heart RRR, no murmur noted. Right lung base slightly coarse initially but cleared after deep breath, otherwise lungs are clear. No pedal edema noted. No carotid bruits noted.   PROVIDERS: Lajean Manes, MD is PCP   LABS: Labs reviewed: Acceptable for surgery. Cr 1.04. Glucose 91.  WBC 3.3. Differential WNL. Currently, I do not have any comparison labs.  (all labs ordered are listed, but only abnormal results are displayed)  Labs Reviewed  CBC WITH DIFFERENTIAL/PLATELET - Abnormal; Notable for the following components:      Result Value   WBC 3.3 (*)    Hemoglobin 11.2 (*)    HCT 35.8 (*)    MCH 25.8 (*)    All other components within normal limits  COMPREHENSIVE METABOLIC PANEL - Abnormal; Notable for the following components:   Creatinine, Ser 1.04 (*)    GFR calc non Af Amer 57 (*)    All other components within normal limits  PROTIME-INR  TYPE AND SCREEN  ABO/RH  IMAGES:No results found. She reports that Dr. Barry Dienes has a imaging CD from [facility].  EKG: 09/26/17: NSR.   CV: N/A  Past Medical History:  Diagnosis Date  . Anemia    iron deficiency anemia  . 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 Dyspnea    due to liver cyst  . GERD (gastroesophageal reflux disease)   . Headache   . History of kidney stones     Past Surgical History:  Procedure Laterality Date  . ABDOMINAL HYSTERECTOMY    . BREAST EXCISIONAL BIOPSY Left   . CHOLECYSTECTOMY    . COLPOSCOPY    . WISDOM TOOTH EXTRACTION      MEDICATIONS: . acetaminophen (TYLENOL) 160 MG/5ML liquid  . Ascorbic Acid (VITAMIN C PO)  . Calcium Carbonate-Vitamin D (CALCIUM-D PO)  . estradiol (VIVELLE-DOT) 0.0375 MG/24HR   . ferrous sulfate 220 (44 Fe) MG/5ML solution  . Multiple Vitamin (MULTIVITAMIN WITH MINERALS) TABS tablet  . naproxen sodium (ALEVE) 220 MG tablet  . omeprazole (PRILOSEC) 20 MG capsule  . Polyethyl Glycol-Propyl Glycol (SYSTANE OP)  . pseudoephedrine (SUDAFED) 30 MG tablet  . simethicone (MYLICON) 846 MG chewable tablet  . Witch Hazel (PREPARATION H EX)   No current facility-administered medications for this encounter.    [facility] Short Stay Center/Anesthesiology Phone [phone] 09/26/2017 2:40 PM

## 2017-09-26 NOTE — H&P (Signed)
[name] Documented: 09/19/2017 9:44 AM Location: [name] Patient #: [id number] DOB: 1955-06-22 Single / Language: [name] / Race: Black or African American Female   History of Present Illness Janet Klein MD; 09/19/2017 10:40 AM) The patient is a 61 year old female who presents with a liver mass. Pt is a 62 yo F who was referred by Dr. Felipa Adams for consultation regarding a large symptomatic liver cyst. The patient has been having intermittent abdominal pain for several months. She has already had a cholecystectomy. She denies jaundice. She had a trial of prilosec that has not improved matters. She had a CT abd/pelvis at [facility] (we have report in system. Images also reviewed on CD) that showed a large right hepatic mass superiorly. She has been having shortness of breath. She describes mid and right upper quadrant pain that is fairly positional in nature. She takes care of her 49 yo mother who is having dementia. She is having a harder time. She is unable to get comfortable to sleep.    Past Surgical History [name]; 09/19/2017 9:44 AM) Gallbladder Adams - Laparoscopic  Hysterectomy (not due to cancer) - Complete  Oral Adams   Diagnostic Studies History [name]; 09/19/2017 9:44 AM) Colonoscopy  1-5 years ago Mammogram  within last year Pap Smear  >5 years ago  Allergies [name]; 09/19/2017 9:46 AM) Allergies Reconciled   Medication History [name]; 09/19/2017 9:45 AM) Estradiol (0.0375MG /24HR Patch TW, Transdermal) Active. Medications Reconciled  Social History [name]; 09/19/2017 9:44 AM) Caffeine use  Carbonated beverages, Coffee, Tea. No alcohol use  No drug use  Tobacco use  Never smoker.  Family History [name]; 09/19/2017 9:44 AM) Alcohol Abuse  Father. Cancer  Father. Cerebrovascular Accident  Mother. Colon Polyps  Father. Depression  Brother. Hypertension  Brother, Father,  Mother. Kidney Disease  Brother. Prostate Cancer  Father.  Pregnancy / Birth History [name]; 09/19/2017 9:44 AM) Age at menarche  42 years. Age of menopause  <47 Gravida  0 Para  0 Regular periods   Other Problems [name]; 09/19/2017 9:44 AM) Anxiety Disorder  Back Pain  Bladder Problems  Chest pain  Depression  Gastroesophageal Reflux Disease  Hemorrhoids  Kidney Stone  Lump In Breast  Other disease, cancer, significant illness     Review of Systems Janet Klein MD; 09/19/2017 10:40 AM) General Present- Chills, Fever and Night Sweats. Not Present- Appetite Loss, Fatigue, Weight Gain and Weight Loss. Skin Not Present- Change in Wart/Mole, Dryness, Hives, Jaundice, New Lesions, Non-Healing Wounds, Rash and Ulcer. HEENT Present- Wears glasses/contact lenses. Not Present- Earache, Hearing Loss, Hoarseness, Nose Bleed, Oral Ulcers, Ringing in the Ears, Seasonal Allergies, Sinus Pain, Sore Throat, Visual Disturbances and Yellow Eyes. Respiratory Not Present- Bloody sputum, Chronic Cough, Difficulty Breathing, Snoring and Wheezing. Breast Not Present- Breast Mass, Breast Pain, Nipple Discharge and Skin Changes. Cardiovascular Present- Rapid Heart Rate and Shortness of Breath. Not Present- Chest Pain, Difficulty Breathing Lying Down, Leg Cramps, Palpitations and Swelling of Extremities. Gastrointestinal Present- Abdominal Pain and Indigestion. Not Present- Bloating, Bloody Stool, Change in Bowel Habits, Chronic diarrhea, Constipation, Difficulty Swallowing, Excessive gas, Gets full quickly at meals, Hemorrhoids, Nausea, Rectal Pain and Vomiting. Female Genitourinary Not Present- Frequency, Nocturia, Painful Urination, Pelvic Pain and Urgency. Musculoskeletal Present- Back Pain. Not Present- Joint Pain, Joint Stiffness, Muscle Pain, Muscle Weakness and Swelling of Extremities. Neurological Not Present- Decreased Memory, Fainting, Headaches, Numbness, Seizures,  Tingling, Tremor, Trouble walking and Weakness. Psychiatric Not Present- Anxiety, Bipolar,  Change in Sleep Pattern, Depression, Fearful and Frequent crying. Endocrine Present- Hot flashes. Not Present- Cold Intolerance, Excessive Hunger, Hair Changes, Heat Intolerance and New Diabetes. Hematology Not Present- Blood Thinners, Easy Bruising, Excessive bleeding, Gland problems, HIV and Persistent Infections. All other systems negative  Vitals [name]; 09/19/2017 9:47 AM) 09/19/2017 9:46 AM Weight: 169.25 lb Height: 63in Body Surface Area: 1.8 m Body Mass Index: 29.98 kg/m  Temp.: 98.43F(Oral)  Pulse: 119 (Regular)  BP: 124/82 (Sitting, Left Arm, Standard)       Physical Exam Janet Klein MD; 09/19/2017 10:41 AM) General Mental Status-Alert. General Appearance-Consistent with stated age. Hydration-Well hydrated. Voice-Normal.  Head and Neck Head-normocephalic, atraumatic with no lesions or palpable masses. Trachea-midline. Thyroid Gland Characteristics - normal size and consistency.  Eye Eyeball - Bilateral-Extraocular movements intact. Sclera/Conjunctiva - Bilateral-No scleral icterus.  Chest and Lung Exam Chest and lung exam reveals -quiet, even and easy respiratory effort with no use of accessory muscles and on auscultation, normal breath sounds, no adventitious sounds and normal vocal resonance. Inspection Chest Wall - Normal. Back - normal.  Cardiovascular Cardiovascular examination reveals -normal heart sounds, regular rate and rhythm with no murmurs and normal pedal pulses bilaterally.  Abdomen Inspection Inspection of the abdomen reveals - No Hernias. Palpation/Percussion Palpation and Percussion of the abdomen reveal - Soft, No Rebound tenderness, No Rigidity (guarding) and No hepatosplenomegaly. Note: tender RUQ. Auscultation Auscultation of the abdomen reveals - Bowel sounds normal.  Neurologic Neurologic evaluation  reveals -alert and oriented x 3 with no impairment of recent or remote memory. Mental Status-Normal.  Musculoskeletal Global Assessment -Note: no gross deformities.  Normal Exam - Left-Upper Extremity Strength Normal and Lower Extremity Strength Normal. Normal Exam - Right-Upper Extremity Strength Normal and Lower Extremity Strength Normal.  Lymphatic Head & Neck  General Head & Neck Lymphatics: Bilateral - Description - Normal. Axillary  General Axillary Region: Bilateral - Description - Normal. Tenderness - Non Tender. Femoral & Inguinal  Generalized Femoral & Inguinal Lymphatics: Bilateral - Description - No Generalized lymphadenopathy.    Assessment & Plan Janet Klein MD; 09/19/2017 10:42 AM) LIVER CYST (K76.89) Impression: Pt has right liver cyst that is symptomatic. Will plan lap unroofing of liver cyst. Discussed risks of Adams including bleeding, bile leak, conversion to open operation. Also reviewed risks of blood clot, pneumonia, infection , hernia at incision, and more. there is also risk of cyst recurrence.  She understands and wishes to proceed.  Will schedule this as soon as possible. Current Plans You are being scheduled for Adams- Our schedulers will call you.  You should hear from our office's scheduling department within 5 working days about the location, date, and time of Adams. We try to make accommodations for patient's preferences in scheduling Adams, but sometimes the OR schedule or the surgeon's schedule prevents [place] from making those accommodations.  If you have not heard from our office [phone]) in 5 working days, call the office and ask for your surgeon's nurse.  If you have other questions about your diagnosis, plan, or Adams, call the office and ask for your surgeon's nurse.  Pt Education - [facility] Laparoscopic Adams HCI   Signed by Janet Klein, MD (09/19/2017 10:43 AM)

## 2017-09-27 ENCOUNTER — Encounter (HOSPITAL_COMMUNITY)
Admission: RE | Disposition: A | Discharge status: Home / Self Care | Payer: Self-pay | Source: Ambulatory Visit | Attending: General Surgery

## 2017-09-27 ENCOUNTER — Inpatient Hospital Stay (HOSPITAL_COMMUNITY)
Admission: RE | Admit: 2017-09-27 | Discharge: 2017-09-29 | DRG: 421 | Disposition: A | Discharge status: Home / Self Care | Payer: Self-pay | Source: Ambulatory Visit | Attending: General Surgery | Admitting: General Surgery

## 2017-09-27 ENCOUNTER — Ambulatory Visit (HOSPITAL_COMMUNITY): Payer: Self-pay | Admitting: Certified Registered Nurse Anesthetist

## 2017-09-27 ENCOUNTER — Encounter (HOSPITAL_COMMUNITY): Payer: Self-pay | Admitting: Certified Registered Nurse Anesthetist

## 2017-09-27 ENCOUNTER — Ambulatory Visit (HOSPITAL_COMMUNITY): Payer: Self-pay | Admitting: Vascular Surgery

## 2017-09-27 DIAGNOSIS — Z841 Family history of disorders of kidney and ureter: Secondary | ICD-10-CM

## 2017-09-27 DIAGNOSIS — Z8042 Family history of malignant neoplasm of prostate: Secondary | ICD-10-CM

## 2017-09-27 DIAGNOSIS — Z8371 Family history of colonic polyps: Secondary | ICD-10-CM

## 2017-09-27 DIAGNOSIS — R0602 Shortness of breath: Secondary | ICD-10-CM | POA: Diagnosis not present

## 2017-09-27 DIAGNOSIS — Z9049 Acquired absence of other specified parts of digestive tract: Secondary | ICD-10-CM

## 2017-09-27 DIAGNOSIS — K219 Gastro-esophageal reflux disease without esophagitis: Secondary | ICD-10-CM | POA: Diagnosis present

## 2017-09-27 DIAGNOSIS — F419 Anxiety disorder, unspecified: Secondary | ICD-10-CM | POA: Diagnosis present

## 2017-09-27 DIAGNOSIS — K7689 Other specified diseases of liver: Principal | ICD-10-CM | POA: Diagnosis present

## 2017-09-27 DIAGNOSIS — Z811 Family history of alcohol abuse and dependence: Secondary | ICD-10-CM

## 2017-09-27 DIAGNOSIS — Z823 Family history of stroke: Secondary | ICD-10-CM

## 2017-09-27 DIAGNOSIS — Z8249 Family history of ischemic heart disease and other diseases of the circulatory system: Secondary | ICD-10-CM

## 2017-09-27 DIAGNOSIS — D62 Acute posthemorrhagic anemia: Secondary | ICD-10-CM | POA: Diagnosis not present

## 2017-09-27 DIAGNOSIS — Z90710 Acquired absence of both cervix and uterus: Secondary | ICD-10-CM

## 2017-09-27 DIAGNOSIS — Z87442 Personal history of urinary calculi: Secondary | ICD-10-CM

## 2017-09-27 DIAGNOSIS — Z818 Family history of other mental and behavioral disorders: Secondary | ICD-10-CM

## 2017-09-27 DIAGNOSIS — M25511 Pain in right shoulder: Secondary | ICD-10-CM | POA: Diagnosis not present

## 2017-09-27 DIAGNOSIS — Z888 Allergy status to other drugs, medicaments and biological substances status: Secondary | ICD-10-CM

## 2017-09-27 HISTORY — PX: LAPAROSCOPIC LIVER CYST REMOVAL: SHX5900

## 2017-09-27 LAB — PREPARE RBC (CROSSMATCH)

## 2017-09-27 SURGERY — EXCISION, CYST, LIVER, LAPAROSCOPIC
Anesthesia: General | Site: Abdomen

## 2017-09-27 MED ORDER — ACETAMINOPHEN 160 MG/5ML PO SUSP: 640.0000 mg | Freq: Four times a day (QID) | ORAL | Status: DC | PRN

## 2017-09-27 MED ORDER — ONDANSETRON 4 MG PO TBDP: 4.0000 mg | ORAL_TABLET | Freq: Four times a day (QID) | ORAL | Status: DC | PRN

## 2017-09-27 MED ORDER — KCL IN DEXTROSE-NACL 20-5-0.45 MEQ/L-%-% IV SOLN
INTRAVENOUS | Status: DC
  Administered 2017-09-27 – 2017-09-28 (×3): via INTRAVENOUS
  Filled 2017-09-27 (×3): qty 1000

## 2017-09-27 MED ORDER — LIDOCAINE HCL (PF) 1 % IJ SOLN
INTRAMUSCULAR | Status: AC
  Filled 2017-09-27: qty 30

## 2017-09-27 MED ORDER — KETOROLAC TROMETHAMINE 30 MG/ML IJ SOLN
30.0000 mg | Freq: Four times a day (QID) | INTRAMUSCULAR | Status: AC
  Administered 2017-09-27 – 2017-09-28 (×4): 30 mg via INTRAVENOUS
  Filled 2017-09-27 (×3): qty 1

## 2017-09-27 MED ORDER — HEMOSTATIC AGENTS (NO CHARGE) OPTIME
TOPICAL | Status: DC | PRN
  Administered 2017-09-27 (×3): 1 via TOPICAL

## 2017-09-27 MED ORDER — ROCURONIUM BROMIDE 100 MG/10ML IV SOLN
INTRAVENOUS | Status: DC | PRN
  Administered 2017-09-27: 10 mg via INTRAVENOUS
  Administered 2017-09-27: 50 mg via INTRAVENOUS

## 2017-09-27 MED ORDER — ROCURONIUM BROMIDE 50 MG/5ML IV SOLN
INTRAVENOUS | Status: AC
  Filled 2017-09-27: qty 1

## 2017-09-27 MED ORDER — SIMETHICONE 80 MG PO CHEW
125.0000 mg | CHEWABLE_TABLET | ORAL | Status: DC | PRN
  Filled 2017-09-27: qty 2

## 2017-09-27 MED ORDER — CELECOXIB 200 MG PO CAPS
200.0000 mg | ORAL_CAPSULE | ORAL | Status: DC
  Filled 2017-09-27: qty 1

## 2017-09-27 MED ORDER — SUGAMMADEX SODIUM 200 MG/2ML IV SOLN
INTRAVENOUS | Status: AC
  Filled 2017-09-27: qty 2

## 2017-09-27 MED ORDER — ONDANSETRON HCL 4 MG/2ML IJ SOLN
INTRAMUSCULAR | Status: DC | PRN
  Administered 2017-09-27: 4 mg via INTRAVENOUS

## 2017-09-27 MED ORDER — SUGAMMADEX SODIUM 200 MG/2ML IV SOLN
INTRAVENOUS | Status: DC | PRN
  Administered 2017-09-27: 160 mg via INTRAVENOUS

## 2017-09-27 MED ORDER — LACTATED RINGERS IV SOLN
INTRAVENOUS | Status: DC
  Administered 2017-09-27: 07:00:00 via INTRAVENOUS

## 2017-09-27 MED ORDER — POLYETHYL GLYCOL-PROPYL GLYCOL 0.4-0.3 % OP GEL: Freq: Every day | OPHTHALMIC | Status: DC | PRN

## 2017-09-27 MED ORDER — METHOCARBAMOL 1000 MG/10ML IJ SOLN
500.0000 mg | Freq: Three times a day (TID) | INTRAMUSCULAR | Status: DC | PRN
  Administered 2017-09-27 – 2017-09-29 (×4): 500 mg via INTRAVENOUS
  Filled 2017-09-27 (×4): qty 5

## 2017-09-27 MED ORDER — FENTANYL CITRATE (PF) 250 MCG/5ML IJ SOLN
INTRAMUSCULAR | Status: DC | PRN
  Administered 2017-09-27 (×3): 50 ug via INTRAVENOUS

## 2017-09-27 MED ORDER — DEXAMETHASONE SODIUM PHOSPHATE 10 MG/ML IJ SOLN
INTRAMUSCULAR | Status: AC
  Filled 2017-09-27: qty 1

## 2017-09-27 MED ORDER — HYDROMORPHONE HCL 2 MG/ML IJ SOLN
0.3000 mg | INTRAMUSCULAR | Status: DC | PRN
  Administered 2017-09-27 (×4): 0.5 mg via INTRAVENOUS

## 2017-09-27 MED ORDER — LIDOCAINE 2% (20 MG/ML) 5 ML SYRINGE
INTRAMUSCULAR | Status: AC
  Filled 2017-09-27: qty 5

## 2017-09-27 MED ORDER — KETOROLAC TROMETHAMINE 30 MG/ML IJ SOLN
INTRAMUSCULAR | Status: AC
  Filled 2017-09-27: qty 1

## 2017-09-27 MED ORDER — SODIUM CHLORIDE 0.9 % IR SOLN
Status: DC | PRN
  Administered 2017-09-27: 1000 mL

## 2017-09-27 MED ORDER — POLYETHYLENE GLYCOL 3350 17 G PO PACK: 17.0000 g | PACK | Freq: Every day | ORAL | Status: DC | PRN

## 2017-09-27 MED ORDER — ACETAMINOPHEN 500 MG PO TABS
1000.0000 mg | ORAL_TABLET | ORAL | Status: DC
  Filled 2017-09-27: qty 2

## 2017-09-27 MED ORDER — SODIUM CHLORIDE 0.9 % IV SOLN: Freq: Once | INTRAVENOUS | Status: DC

## 2017-09-27 MED ORDER — DEXAMETHASONE SODIUM PHOSPHATE 10 MG/ML IJ SOLN
INTRAMUSCULAR | Status: DC | PRN
  Administered 2017-09-27: 10 mg via INTRAVENOUS

## 2017-09-27 MED ORDER — ONDANSETRON HCL 4 MG/2ML IJ SOLN
INTRAMUSCULAR | Status: AC
  Filled 2017-09-27: qty 2

## 2017-09-27 MED ORDER — PSEUDOEPHEDRINE HCL 60 MG PO TABS: 60.0000 mg | ORAL_TABLET | Freq: Every day | ORAL | Status: DC | PRN

## 2017-09-27 MED ORDER — PROPOFOL 10 MG/ML IV BOLUS
INTRAVENOUS | Status: AC
  Filled 2017-09-27: qty 20

## 2017-09-27 MED ORDER — ACETAMINOPHEN 160 MG/5ML PO SOLN
1000.0000 mg | Freq: Once | ORAL | Status: AC
  Administered 2017-09-27: 1000 mg via ORAL

## 2017-09-27 MED ORDER — FENTANYL CITRATE (PF) 250 MCG/5ML IJ SOLN
INTRAMUSCULAR | Status: AC
  Filled 2017-09-27: qty 5

## 2017-09-27 MED ORDER — GABAPENTIN 300 MG PO CAPS
300.0000 mg | ORAL_CAPSULE | ORAL | Status: DC
  Filled 2017-09-27: qty 1

## 2017-09-27 MED ORDER — KETOROLAC TROMETHAMINE 30 MG/ML IJ SOLN
30.0000 mg | Freq: Four times a day (QID) | INTRAMUSCULAR | Status: DC | PRN
  Administered 2017-09-28 (×2): 30 mg via INTRAVENOUS
  Filled 2017-09-27 (×2): qty 1

## 2017-09-27 MED ORDER — LIDOCAINE HCL 1 % IJ SOLN
INTRAMUSCULAR | Status: DC | PRN
  Administered 2017-09-27: 13 mL

## 2017-09-27 MED ORDER — DIPHENHYDRAMINE HCL 12.5 MG/5ML PO ELIX: 12.5000 mg | ORAL_SOLUTION | Freq: Four times a day (QID) | ORAL | Status: DC | PRN

## 2017-09-27 MED ORDER — HYDROMORPHONE HCL 2 MG/ML IJ SOLN
INTRAMUSCULAR | Status: AC
  Filled 2017-09-27: qty 1

## 2017-09-27 MED ORDER — GABAPENTIN 250 MG/5ML PO SOLN
200.0000 mg | Freq: Two times a day (BID) | ORAL | Status: DC
  Administered 2017-09-27 – 2017-09-28 (×3): 200 mg via ORAL
  Filled 2017-09-27 (×4): qty 4

## 2017-09-27 MED ORDER — WITCH HAZEL-GLYCERIN EX PADS: MEDICATED_PAD | Freq: Every day | CUTANEOUS | Status: DC | PRN

## 2017-09-27 MED ORDER — PANTOPRAZOLE SODIUM 40 MG PO PACK
40.0000 mg | PACK | Freq: Every day | ORAL | Status: DC
  Administered 2017-09-27 – 2017-09-28 (×2): 40 mg
  Filled 2017-09-27 (×3): qty 20

## 2017-09-27 MED ORDER — 0.9 % SODIUM CHLORIDE (POUR BTL) OPTIME
TOPICAL | Status: DC | PRN
  Administered 2017-09-27 (×2): 1000 mL

## 2017-09-27 MED ORDER — PHENYLEPHRINE 40 MCG/ML (10ML) SYRINGE FOR IV PUSH (FOR BLOOD PRESSURE SUPPORT)
PREFILLED_SYRINGE | INTRAVENOUS | Status: DC | PRN
  Administered 2017-09-27 (×5): 80 ug via INTRAVENOUS

## 2017-09-27 MED ORDER — BUPIVACAINE-EPINEPHRINE (PF) 0.25% -1:200000 IJ SOLN
INTRAMUSCULAR | Status: AC
  Filled 2017-09-27: qty 30

## 2017-09-27 MED ORDER — LACTATED RINGERS IV SOLN
INTRAVENOUS | Status: DC | PRN
Start: 2017-09-27 — End: 2017-09-27
  Administered 2017-09-27: 07:00:00 via INTRAVENOUS

## 2017-09-27 MED ORDER — METHOCARBAMOL 1000 MG/10ML IJ SOLN
500.0000 mg | INTRAVENOUS | Status: AC
  Administered 2017-09-27: 500 mg via INTRAVENOUS
  Filled 2017-09-27: qty 5

## 2017-09-27 MED ORDER — MIDAZOLAM HCL 2 MG/2ML IJ SOLN
INTRAMUSCULAR | Status: AC
  Filled 2017-09-27: qty 2

## 2017-09-27 MED ORDER — ONDANSETRON HCL 4 MG/2ML IJ SOLN: 4.0000 mg | Freq: Four times a day (QID) | INTRAMUSCULAR | Status: DC | PRN

## 2017-09-27 MED ORDER — CHLORHEXIDINE GLUCONATE CLOTH 2 % EX PADS: 6.0000 | MEDICATED_PAD | Freq: Once | CUTANEOUS | Status: DC

## 2017-09-27 MED ORDER — PHENYLEPHRINE 40 MCG/ML (10ML) SYRINGE FOR IV PUSH (FOR BLOOD PRESSURE SUPPORT)
PREFILLED_SYRINGE | INTRAVENOUS | Status: AC
  Filled 2017-09-27: qty 10

## 2017-09-27 MED ORDER — LIDOCAINE 2% (20 MG/ML) 5 ML SYRINGE
INTRAMUSCULAR | Status: DC | PRN
  Administered 2017-09-27: 60 mg via INTRAVENOUS

## 2017-09-27 MED ORDER — POLYVINYL ALCOHOL 1.4 % OP SOLN: 1.0000 [drp] | Freq: Every day | OPHTHALMIC | Status: DC | PRN

## 2017-09-27 MED ORDER — CEFAZOLIN SODIUM 1 G IJ SOLR
INTRAMUSCULAR | Status: AC
  Filled 2017-09-27: qty 50

## 2017-09-27 MED ORDER — OXYCODONE HCL 5 MG/5ML PO SOLN
5.0000 mg | ORAL | Status: DC | PRN
  Administered 2017-09-27 – 2017-09-29 (×6): 5 mg via ORAL
  Filled 2017-09-27 (×6): qty 5

## 2017-09-27 MED ORDER — PREPARATION H 50 % EX PADS: MEDICATED_PAD | Freq: Every day | CUTANEOUS | Status: DC | PRN

## 2017-09-27 MED ORDER — ROCURONIUM BROMIDE 10 MG/ML (PF) SYRINGE: PREFILLED_SYRINGE | INTRAVENOUS | Status: DC | PRN

## 2017-09-27 MED ORDER — HYDRALAZINE HCL 20 MG/ML IJ SOLN: 10.0000 mg | INTRAMUSCULAR | Status: DC | PRN

## 2017-09-27 MED ORDER — CEFAZOLIN SODIUM-DEXTROSE 2-4 GM/100ML-% IV SOLN
2.0000 g | Freq: Three times a day (TID) | INTRAVENOUS | Status: AC
  Administered 2017-09-27: 2 g via INTRAVENOUS
  Filled 2017-09-27: qty 100

## 2017-09-27 MED ORDER — MIDAZOLAM HCL 5 MG/5ML IJ SOLN
INTRAMUSCULAR | Status: DC | PRN
  Administered 2017-09-27: 2 mg via INTRAVENOUS

## 2017-09-27 MED ORDER — ACETAMINOPHEN 160 MG/5ML PO SOLN
ORAL | Status: AC
  Filled 2017-09-27: qty 40.6

## 2017-09-27 MED ORDER — GABAPENTIN 250 MG/5ML PO SOLN
300.0000 mg | ORAL | Status: AC
  Administered 2017-09-27: 300 mg via ORAL
  Filled 2017-09-27: qty 6

## 2017-09-27 MED ORDER — LACTATED RINGERS IV SOLN
INTRAVENOUS | Status: DC | PRN
  Administered 2017-09-27 (×2): via INTRAVENOUS

## 2017-09-27 MED ORDER — PHENYLEPHRINE HCL 10 MG/ML IJ SOLN
INTRAMUSCULAR | Status: DC | PRN
  Administered 2017-09-27: 20 ug/min via INTRAVENOUS

## 2017-09-27 MED ORDER — DIPHENHYDRAMINE HCL 50 MG/ML IJ SOLN: 12.5000 mg | Freq: Four times a day (QID) | INTRAMUSCULAR | Status: DC | PRN

## 2017-09-27 MED ORDER — FERROUS SULFATE 220 (44 FE) MG/5ML PO ELIX
330.0000 mg | ORAL_SOLUTION | ORAL | Status: DC
  Filled 2017-09-27: qty 7.5

## 2017-09-27 MED ORDER — PROPOFOL 10 MG/ML IV BOLUS
INTRAVENOUS | Status: DC | PRN
  Administered 2017-09-27: 100 mg via INTRAVENOUS

## 2017-09-27 SURGICAL SUPPLY — 84 items
ADH SKN CLS LQ APL DERMABOND (GAUZE/BANDAGES/DRESSINGS) ×1
APPLIER CLIP ROT 10 11.4 M/L (STAPLE)
APR CLP MED LRG 11.4X10 (STAPLE)
BAG SPEC RTRVL 10 TROC 200 (ENDOMECHANICALS) ×1
BLADE CLIPPER SURG (BLADE) IMPLANT
CANISTER SUCT 3000ML PPV (MISCELLANEOUS) ×3 IMPLANT
CELLS DAT CNTRL 66122 CELL SVR (MISCELLANEOUS) IMPLANT
CHLORAPREP W/TINT 26ML (MISCELLANEOUS) ×3 IMPLANT
CLIP APPLIE ROT 10 11.4 M/L (STAPLE) IMPLANT
COVER SURGICAL LIGHT HANDLE (MISCELLANEOUS) ×3 IMPLANT
DECANTER SPIKE VIAL GLASS SM (MISCELLANEOUS) ×4 IMPLANT
DERMABOND ADHESIVE PROPEN (GAUZE/BANDAGES/DRESSINGS) ×2
DERMABOND ADVANCED .7 DNX6 (GAUZE/BANDAGES/DRESSINGS) IMPLANT
DISSECTOR BLUNT TIP ENDO 5MM (MISCELLANEOUS) IMPLANT
DRAPE HALF SHEET 40X57 (DRAPES) IMPLANT
DRAPE LAPAROSCOPIC ABDOMINAL (DRAPES) ×3 IMPLANT
DRAPE UTILITY XL STRL (DRAPES) ×2 IMPLANT
DRAPE WARM FLUID 44X44 (DRAPE) ×5 IMPLANT
ELECT CAUTERY BLADE 6.4 (BLADE) ×3 IMPLANT
ELECT REM PT RETURN 9FT ADLT (ELECTROSURGICAL) ×3
ELECTRODE REM PT RTRN 9FT ADLT (ELECTROSURGICAL) ×1 IMPLANT
FILTER SMOKE EVAC LAPAROSHD (FILTER) ×2 IMPLANT
GAUZE SPONGE 4X4 12PLY STRL (GAUZE/BANDAGES/DRESSINGS) ×3 IMPLANT
GEL ULTRASOUND 20GR AQUASONIC (MISCELLANEOUS) IMPLANT
GLOVE BIO SURGEON STRL SZ 6 (GLOVE) ×6 IMPLANT
GLOVE INDICATOR 6.5 STRL GRN (GLOVE) ×3 IMPLANT
GOWN STRL REUS W/ TWL LRG LVL3 (GOWN DISPOSABLE) ×2 IMPLANT
GOWN STRL REUS W/TWL 2XL LVL3 (GOWN DISPOSABLE) ×6 IMPLANT
GOWN STRL REUS W/TWL LRG LVL3 (GOWN DISPOSABLE) ×6
HEMOSTAT SNOW SURGICEL 2X4 (HEMOSTASIS) ×6 IMPLANT
KIT BASIN OR (CUSTOM PROCEDURE TRAY) ×3 IMPLANT
KIT TURNOVER KIT B (KITS) ×3 IMPLANT
L-HOOK LAP DISP 36CM (ELECTROSURGICAL) ×3
LEGGING LITHOTOMY PAIR STRL (DRAPES) IMPLANT
LHOOK LAP DISP 36CM (ELECTROSURGICAL) ×1 IMPLANT
LIGASURE IMPACT 36 18CM CVD LR (INSTRUMENTS) IMPLANT
LIGASURE MARYLAND LAP STAND (ELECTROSURGICAL) IMPLANT
NS IRRIG 1000ML POUR BTL (IV SOLUTION) ×6 IMPLANT
PAD ARMBOARD 7.5X6 YLW CONV (MISCELLANEOUS) ×6 IMPLANT
PENCIL BUTTON HOLSTER BLD 10FT (ELECTRODE) ×6 IMPLANT
POUCH RETRIEVAL ECOSAC 10 (ENDOMECHANICALS) IMPLANT
POUCH RETRIEVAL ECOSAC 10MM (ENDOMECHANICALS) ×2
RELOAD STAPLE 60 2.6 WHT THN (STAPLE) IMPLANT
RELOAD STAPLER WHITE 60MM (STAPLE) ×4 IMPLANT
RETRACTOR WND ALEXIS 18 MED (MISCELLANEOUS) IMPLANT
RTRCTR WOUND ALEXIS 18CM MED (MISCELLANEOUS)
SCISSORS LAP 5X35 DISP (ENDOMECHANICALS) ×2 IMPLANT
SET IRRIG TUBING LAPAROSCOPIC (IRRIGATION / IRRIGATOR) ×2 IMPLANT
SHEARS HARMONIC ACE PLUS 36CM (ENDOMECHANICALS) ×2 IMPLANT
SLEEVE ENDOPATH XCEL 5M (ENDOMECHANICALS) ×3 IMPLANT
SPECIMEN JAR LARGE (MISCELLANEOUS) ×3 IMPLANT
SPONGE LAP 18X18 X RAY DECT (DISPOSABLE) IMPLANT
STAPLE ECHEON FLEX 60 POW ENDO (STAPLE) ×2 IMPLANT
STAPLER RELOAD WHITE 60MM (STAPLE) ×12
STAPLER VISISTAT 35W (STAPLE) ×1 IMPLANT
SUCTION POOLE TIP (SUCTIONS) ×1 IMPLANT
SURGILUBE 2OZ TUBE FLIPTOP (MISCELLANEOUS) IMPLANT
SUT MNCRL AB 4-0 PS2 18 (SUTURE) ×2 IMPLANT
SUT PDS AB 1 CT  36 (SUTURE)
SUT PDS AB 1 CT 36 (SUTURE) IMPLANT
SUT PROLENE 2 0 CT2 30 (SUTURE) IMPLANT
SUT PROLENE 2 0 KS (SUTURE) IMPLANT
SUT SILK 2 0 (SUTURE)
SUT SILK 2 0 SH CR/8 (SUTURE) ×1 IMPLANT
SUT SILK 2-0 18XBRD TIE 12 (SUTURE) ×1 IMPLANT
SUT SILK 3 0 (SUTURE)
SUT SILK 3 0 SH CR/8 (SUTURE) ×1 IMPLANT
SUT SILK 3-0 18XBRD TIE 12 (SUTURE) ×1 IMPLANT
SYS LAPSCP GELPORT 120MM (MISCELLANEOUS)
SYSTEM LAPSCP GELPORT 120MM (MISCELLANEOUS) IMPLANT
TOWEL OR 17X24 6PK STRL BLUE (TOWEL DISPOSABLE) ×3 IMPLANT
TOWEL OR 17X26 10 PK STRL BLUE (TOWEL DISPOSABLE) ×3 IMPLANT
TRAY FOLEY MTR SLVR 14FR STAT (SET/KITS/TRAYS/PACK) ×2 IMPLANT
TRAY LAPAROSCOPIC MC (CUSTOM PROCEDURE TRAY) ×3 IMPLANT
TROCAR XCEL 12X100 BLDLESS (ENDOMECHANICALS) ×2 IMPLANT
TROCAR XCEL BLUNT TIP 100MML (ENDOMECHANICALS) ×3 IMPLANT
TROCAR XCEL NON-BLD 11X100MML (ENDOMECHANICALS) IMPLANT
TROCAR XCEL NON-BLD 5MMX100MML (ENDOMECHANICALS) ×5 IMPLANT
TUBE CONNECTING 12'X1/4 (SUCTIONS) ×1
TUBE CONNECTING 12X1/4 (SUCTIONS) ×2 IMPLANT
TUBING INSUF HEATED (TUBING) ×1 IMPLANT
TUBING INSUFFLATION (TUBING) ×3 IMPLANT
WATER STERILE IRR 1000ML POUR (IV SOLUTION) ×3 IMPLANT
YANKAUER SUCT BULB TIP NO VENT (SUCTIONS) ×2 IMPLANT

## 2017-09-27 NOTE — Anesthesia Postprocedure Evaluation (Signed)
Anesthesia Post Note  Patient: SAMIKA VETSCH  Procedure(s) Performed: LAPAROSCOPIC LIVER CYST UNROOFING (N/A Abdomen)     Patient location during evaluation: PACU Anesthesia Type: General Level of consciousness: awake and alert Pain management: pain level controlled Vital Signs Assessment: post-procedure vital signs reviewed and stable Respiratory status: spontaneous breathing, nonlabored ventilation, respiratory function stable and patient connected to nasal cannula oxygen Cardiovascular status: blood pressure returned to baseline and stable Postop Assessment: no apparent nausea or vomiting Anesthetic complications: no    Last Vitals:  Vitals:   09/27/17 1234 09/27/17 1245  BP: 113/64   Pulse: 72 73  Resp: (!) 22 (!) 24  Temp:    SpO2: 97% 96%    Last Pain:  Vitals:   09/27/17 1242  TempSrc:   PainSc: 6                  Denissa Cozart,W. EDMOND

## 2017-09-27 NOTE — Transfer of Care (Signed)
Immediate Anesthesia Transfer of Care Note  Patient: [name]  Procedure(s) Performed: LAPAROSCOPIC LIVER CYST UNROOFING (N/A Abdomen)  Patient Location: PACU  Anesthesia Type:General  Level of Consciousness: awake, alert  and oriented  Airway & Oxygen Therapy: Patient Spontanous Breathing  Post-op Assessment: Report given to RN, Post -op Vital signs reviewed and stable and Patient moving all extremities X 4  Post vital signs: Reviewed and stable  Last Vitals:  Vitals Value Taken Time  BP 112/74 09/27/2017 11:34 AM  Temp    Pulse 82 09/27/2017 11:34 AM  Resp 23 09/27/2017 11:34 AM  SpO2 96 % 09/27/2017 11:34 AM  Vitals shown include unvalidated device data.  Last Pain:  Vitals:   09/27/17 0656  TempSrc:   PainSc: 0-No pain      Patients Stated Pain Goal: 5 (41/63/84 [id number])  Complications: No apparent anesthesia complications

## 2017-09-27 NOTE — Interval H&P Note (Signed)
History and Physical Interval Note:  09/27/2017 8:32 AM  [name]  has presented today for surgery, with the diagnosis of LIVER CYST  The various methods of treatment have been discussed with the patient and family. After consideration of risks, benefits and other options for treatment, the patient has consented to  Procedure(s): LAPAROSCOPIC LIVER CYST UNROOFING (N/A) as a surgical intervention .  The patient's history has been reviewed, patient examined, no change in status, stable for surgery.  I have reviewed the patient's chart and labs.  Questions were answered to the patient's satisfaction.     [name]

## 2017-09-27 NOTE — Anesthesia Procedure Notes (Signed)
Procedure Name: Intubation Date/Time: 09/27/2017 8:56 AM Performed by: Harden Mo, CRNA Pre-anesthesia Checklist: Patient identified, Emergency Drugs available, Suction available and Patient being monitored Patient Re-evaluated:Patient Re-evaluated prior to induction Oxygen Delivery Method: Circle System Utilized Preoxygenation: Pre-oxygenation with 100% oxygen Induction Type: IV induction Ventilation: Mask ventilation without difficulty and Oral airway inserted - appropriate to patient size Laryngoscope Size: Sabra Heck and 2 Grade View: Grade I Tube type: Oral Tube size: 7.5 mm Number of attempts: 1 Airway Equipment and Method: Stylet and Oral airway Placement Confirmation: ETT inserted through vocal cords under direct vision,  positive ETCO2 and breath sounds checked- equal and bilateral Secured at: 22 cm Tube secured with: Tape Dental Injury: Teeth and Oropharynx as per pre-operative assessment

## 2017-09-27 NOTE — Progress Notes (Signed)
Waiting on RN to call back for report, pt ready for transport

## 2017-09-27 NOTE — Op Note (Signed)
PRE-OPERATIVE DIAGNOSIS: large right liver cyst  POST-OPERATIVE DIAGNOSIS: Same  PROCEDURE: Procedure(s): Laparoscopic liver cyst unroofing (marsupialization or fenestration), liver biopsy, laparoscopic intraoperative liver ultrasound  SURGEON: Surgeon(s): [name], MD  ASSIST: Judyann Munson, RNFA  ANESTHESIA: local and general  DRAINS: none   LOCAL MEDICATIONS USED: BUPIVICAINE and LIDOCAINE   SPECIMEN: Source of Specimen:diaphragmatic nodules, liver nodule, liver cyst fluid (cytology)  liver cyst wall  DISPOSITION OF SPECIMEN: PATHOLOGY- frozen of liver nodule and diaphragm nodules negative for malignancy, remainder for permanent  COUNTS: YES  DICTATION: .Dragon Dictation  PLAN OF CARE: Admit to inpatient   PATIENT DISPOSITION: PACU - hemodynamically stable.  FINDINGS: Large liver cyst  EBL: 200 mL  PROCEDURE:   Pt was identified in the holding area and taken to the OR where she was placed supine on the operating room table. General anesthesia was induced. The patient's abdomen was prepped and draped in sterile fashion. A timeout was performed according to the surgical safety checklist. When all was correct, we continued.   The infraumbilical skin was infiltrated with local anesthetic. A vertical incision was made with the #11 blade. A kelly clamp was used to spread the subcutaneous skin. Two Kocher clamps were used to elevate the fascia. The #11 blade was used to incise the fascia. A 0-0 vicryl pursestring suture was placed into the fascia. The hasson trocar was advanced into the abdomen and pneumoperitoneum was achieved to a pressure of 15 mm Hg.   Several additional 5 mm trocars were placed in the RUQ and epigastric region. The right diaphragm had numerous firm nodules on it.  The laparoscopic biopsy forceps were used to strip some of the nodules for frozen section.  There was also a small liver nodule that was biopsied.    The liver was  then systematically ultrasounded.  The cyst was far superior in the dome of the liver.  Anteriorly there was a 1-2 cm of liver overlying it.  The epigastric port was upsized to a 12 mm port.  The frozen sections returned back as benign.    The cyst was identified. The harmonic scalpel was used to enter the cyst. A sample of the cyst fluid was sent for cytology.  The fluid was suctioned out. The harmonic scalpel was used to take off the top of the cyst. Several staple loads were used on the thicker portion of liver and near the hepatic veins.  The cyst wall was removed with an EcoSac and removed from the epigastric port.  The port size had to be upsized in order to remove the ecosac.    SNOW hemostatic agent was placed against the posterior cyst wall. Hemostasis was achieved with cautery. A 4 quadrant inspection was negative for evidence of bleeding or gross pathology.   The 5 mm trocars were removed. The pneumoperitoneum was evacuated. The pursestring suture was tied down. There was no residual palpable fascial defect.The fascia of the epigastric port was closed with a 0-0 vicryl as well.   The skin was closed with 4-0 monocryl in subcuticular fashion. Needle, sponge, and instrument counts were correct times 2.   The patient was taken to the PACU in stable condition.

## 2017-09-28 ENCOUNTER — Encounter (HOSPITAL_COMMUNITY): Payer: Self-pay | Admitting: General Surgery

## 2017-09-28 LAB — COMPREHENSIVE METABOLIC PANEL
ALBUMIN: 2.8 g/dL — AB (ref 3.5–5.0)
ALK PHOS: 55 U/L (ref 38–126)
ALT: 102 U/L — ABNORMAL HIGH (ref 14–54)
AST: 86 U/L — AB (ref 15–41)
Anion gap: 7 (ref 5–15)
BILIRUBIN TOTAL: 0.2 mg/dL — AB (ref 0.3–1.2)
BUN: 7 mg/dL (ref 6–20)
CALCIUM: 8.4 mg/dL — AB (ref 8.9–10.3)
CO2: 25 mmol/L (ref 22–32)
Chloride: 103 mmol/L (ref 101–111)
Creatinine, Ser: 0.87 mg/dL (ref 0.44–1.00)
GFR calc Af Amer: >60 mL/min (ref >60)
GFR calc non Af Amer: >60 mL/min (ref >60)
GLUCOSE: 129 mg/dL — AB (ref 65–99)
Potassium: 4.2 mmol/L (ref 3.5–5.1)
SODIUM: 135 mmol/L (ref 135–145)
TOTAL PROTEIN: 5.4 g/dL — AB (ref 6.5–8.1)

## 2017-09-28 LAB — CBC
HEMATOCRIT: 26.8 % — AB (ref 36.0–46.0)
Hemoglobin: 8.1 g/dL — ABNORMAL LOW (ref 12.0–15.0)
MCH: 25.3 pg — ABNORMAL LOW (ref 26.0–34.0)
MCHC: 30.2 g/dL (ref 30.0–36.0)
MCV: 83.8 fL (ref 78.0–100.0)
Platelets: 211 10*3/uL (ref 150–400)
RBC: 3.2 MIL/uL — AB (ref 3.87–5.11)
RDW: 14.6 % (ref 11.5–15.5)
WBC: 8 10*3/uL (ref 4.0–10.5)

## 2017-09-28 LAB — PHOSPHORUS: Phosphorus: 3.2 mg/dL (ref 2.5–4.6)

## 2017-09-28 LAB — MAGNESIUM: Magnesium: 1.6 mg/dL — ABNORMAL LOW (ref 1.7–2.4)

## 2017-09-28 MED ORDER — FERROUS SULFATE 300 (60 FE) MG/5ML PO SYRP
300.0000 mg | ORAL_SOLUTION | ORAL | Status: DC
  Administered 2017-09-28: 300 mg via ORAL
  Filled 2017-09-28: qty 5

## 2017-09-28 NOTE — Discharge Instructions (Signed)
[place] Office Phone Number [phone]   POST OP INSTRUCTIONS  Always review your discharge instruction sheet given to you by the facility where your surgery was performed.  IF YOU HAVE DISABILITY OR FAMILY LEAVE FORMS, YOU MUST BRING THEM TO THE OFFICE FOR PROCESSING.  DO NOT GIVE THEM TO YOUR DOCTOR.  1. A prescription for pain medication may be given to you upon discharge.  Take your pain medication as prescribed, if needed.  If narcotic pain medicine is not needed, then you may take acetaminophen (Tylenol) or ibuprofen (Advil) as needed. 2. Take your usually prescribed medications unless otherwise directed 3. If you need a refill on your pain medication, please contact your pharmacy.  They will contact our office to request authorization.  Prescriptions will not be filled after 5pm or on week-ends. 4. You should eat very light the first 24 hours after surgery, such as soup, crackers, pudding, etc.  Resume your normal diet the day after surgery 5. It is common to experience some constipation if taking pain medication after surgery.  Increasing fluid intake and taking a stool softener will usually help or prevent this problem from occurring.  A mild laxative (Milk of Magnesia or Miralax) should be taken according to package directions if there are no bowel movements after 48 hours. 6. You may shower in 48 hours.  The surgical glue will flake off in 2-3 weeks.   7. ACTIVITIES:  No strenuous activity or heavy lifting for 1 week.   a. You may drive when you no longer are taking prescription pain medication, you can comfortably wear a seatbelt, and you can safely maneuver your car and apply brakes. b. RETURN TO WORK:  __________1-3 weeks_______________ Dennis Bast should see your doctor in the office for a follow-up appointment approximately three-four weeks after your surgery.    WHEN TO CALL YOUR DOCTOR: 1. Fever over 101.0 2. Nausea and/or vomiting. 3. Extreme swelling or  bruising. 4. Continued bleeding from incision. 5. Increased pain, redness, or drainage from the incision.  The clinic staff is available to answer your questions during regular business hours.  Please dont hesitate to call and ask to speak to one of the nurses for clinical concerns.  If you have a medical emergency, go to the nearest emergency room or call 911.  A surgeon from [facility] is always on call at the hospital.  For further questions, please visit [email or web address]

## 2017-09-28 NOTE — Progress Notes (Signed)
1 Day Post-Op   Subjective/Chief Complaint: Sore, hasn't had any real food yet.  Still a bit of shortness of breath, right shoulder pain.     Objective: Vital signs in last 24 hours: Temp:  [97.4 F (36.3 C)-98.8 F (37.1 C)] 98.7 F (37.1 C) (05/15 0558) Pulse Rate:  [62-86] 85 (05/15 0558) Resp:  [16-34] 16 (05/15 0558) BP: (91-130)/(59-78) 108/59 (05/15 0558) SpO2:  [93 %-100 %] 96 % (05/15 0558) Last BM Date: 09/26/17  Intake/Output from previous day: 05/14 0701 - 05/15 0700 In: 4445 [P.O.:480; I.V.:3810; IV Piggyback:155] Out: 3305 [Urine:2105; Blood:200] Intake/Output this shift: No intake/output data recorded.  General appearance: alert, cooperative and mild distress Resp: breathing comfortably Cardio: regular rate and rhythm GI: soft, non distended, approp tender at incisions Extremities: extremities normal, atraumatic, no cyanosis or edema  Lab Results:  Recent Labs    09/26/17 1040 09/28/17 0444  WBC 3.3* 8.0  HGB 11.2* 8.1*  HCT 35.8* 26.8*  PLT 270 211   BMET Recent Labs    09/26/17 1040 09/28/17 0444  NA 139 135  K 3.9 4.2  CL 105 103  CO2 26 25  GLUCOSE 91 129*  BUN 8 7  CREATININE 1.04* 0.87  CALCIUM 9.7 8.4*   PT/INR Recent Labs    09/26/17 1040  LABPROT 13.5  INR 1.04   ABG No results for input(s): PHART, HCO3 in the last 72 hours.  Invalid input(s): PCO2, PO2  Studies/Results: No results found.  Anti-infectives: Anti-infectives (From admission, onward)   Start     Dose/Rate Route Frequency Ordered Stop   09/27/17 1700  ceFAZolin (ANCEF) IVPB 2g/100 mL premix     2 g 200 mL/hr over 30 Minutes Intravenous Every 8 hours 09/27/17 1308 09/27/17 1922   09/27/17 0700  ceFAZolin (ANCEF) IVPB 2g/100 mL premix     2 g 200 mL/hr over 30 Minutes Intravenous To [facility]Stay Surgical 09/26/17 1123 09/27/17 0915      Assessment/Plan: s/p Procedure(s): LAPAROSCOPIC LIVER CYST UNROOFING (N/A) Plan for discharge tomorrow ABL anemia   Decrease IVF Pulmonary toilet Pt shoulder pain and SOB likely from diaphragmatic irritation from biopsy/stripping of cyst wall from the inferior aspect.      LOS: 0 days    [name] 09/28/2017

## 2017-09-29 ENCOUNTER — Other Ambulatory Visit: Payer: Self-pay | Admitting: Physician Assistant

## 2017-09-29 LAB — CBC
HCT: 26 % — ABNORMAL LOW (ref 36.0–46.0)
Hemoglobin: 7.8 g/dL — ABNORMAL LOW (ref 12.0–15.0)
MCH: 24.7 pg — ABNORMAL LOW (ref 26.0–34.0)
MCHC: 30 g/dL (ref 30.0–36.0)
MCV: 82.3 fL (ref 78.0–100.0)
Platelets: 181 K/uL (ref 150–400)
RBC: 3.16 MIL/uL — ABNORMAL LOW (ref 3.87–5.11)
RDW: 14.6 % (ref 11.5–15.5)
WBC: 7.1 K/uL (ref 4.0–10.5)

## 2017-09-29 LAB — COMPREHENSIVE METABOLIC PANEL
ALBUMIN: 2.7 g/dL — AB (ref 3.5–5.0)
ALK PHOS: 52 U/L (ref 38–126)
ALT: 65 U/L — ABNORMAL HIGH (ref 14–54)
AST: 44 U/L — AB (ref 15–41)
Anion gap: 6 (ref 5–15)
BILIRUBIN TOTAL: 0.4 mg/dL (ref 0.3–1.2)
BUN: 7 mg/dL (ref 6–20)
CALCIUM: 8.3 mg/dL — AB (ref 8.9–10.3)
CO2: 26 mmol/L (ref 22–32)
Chloride: 105 mmol/L (ref 101–111)
Creatinine, Ser: 0.97 mg/dL (ref 0.44–1.00)
GFR calc Af Amer: >60 mL/min (ref >60)
GFR calc non Af Amer: >60 mL/min (ref >60)
GLUCOSE: 106 mg/dL — AB (ref 65–99)
Potassium: 4.1 mmol/L (ref 3.5–5.1)
Sodium: 137 mmol/L (ref 135–145)
TOTAL PROTEIN: 5.3 g/dL — AB (ref 6.5–8.1)

## 2017-09-29 MED ORDER — OXYCODONE HCL 5 MG/5ML PO SOLN: 5.0000 mg | Freq: Four times a day (QID) | ORAL | 0 refills | Status: DC | PRN

## 2017-09-29 NOTE — Discharge Summary (Signed)
Physician Discharge Summary  Patient ID: [name] MRN: [id number] DOB/AGE: December 25, 1955 62 y.o.  Admit date: 09/27/2017 Discharge date: 09/29/2017  Admission Diagnoses: Patient Active Problem List   Diagnosis Date Noted  . Liver cyst 09/27/2017    Discharge Diagnoses:  Active Problems:   Liver cyst Acute blood loss anemia  Discharged Condition: stable  Hospital Course:  Pt was admitted to the floor following laparoscopic right liver cyst unroofing.  She did well overnight, tolerating foley removal and being able to void spontaneously.  She did drop her HCT a bit overnight on POD 0-1.  This was stable on POD 2.  She is able to eat without n/v.  She is passing gas.  She is ambulating independently.  Pathology from the cyst is benign.  She is tolerating liquid oxycodone without difficulty.    Consults: None  Significant Diagnostic Studies: labs: HCT 26 prior to d/c.    Treatments: surgery: see above  Discharge Exam: Blood pressure 117/77, pulse 94, temperature 98.8 F (37.1 C), temperature source Oral, resp. rate 16, SpO2 98 %. General appearance: alert, cooperative and no distress Resp: breathing comfortably GI: soft, mildly distended, approp tender. Extremities: extremities normal, atraumatic, no cyanosis or edema  Disposition: Discharge disposition: 01-Home or Self Care       Discharge Instructions    Call MD for:  difficulty breathing, headache or visual disturbances   Complete by:  As directed    Call MD for:  persistant nausea and vomiting   Complete by:  As directed    Call MD for:  redness, tenderness, or signs of infection (pain, swelling, redness, odor or green/yellow discharge around incision site)   Complete by:  As directed    Call MD for:  severe uncontrolled pain   Complete by:  As directed    Call MD for:  temperature >100.4   Complete by:  As directed    Diet - low sodium heart healthy   Complete by:  As directed    Increase activity slowly    Complete by:  As directed      Allergies as of 09/29/2017   No Known Allergies     Medication List    TAKE these medications   acetaminophen 160 MG/5ML liquid Commonly known as:  TYLENOL Take 325-500 mg by mouth daily as needed for pain.   CALCIUM-D PO Take 1 tablet by mouth 2 (two) times a week.   estradiol 0.0375 MG/24HR Commonly known as:  VIVELLE-DOT Place 1 patch onto the skin once a week.   ferrous sulfate 220 (44 Fe) MG/5ML solution Take 330 mg by mouth every Monday, Wednesday, and Friday.   multivitamin with minerals Tabs tablet Take 1 tablet by mouth 2 (two) times a week.   naproxen sodium 220 MG tablet Commonly known as:  ALEVE Take 220-440 mg by mouth daily as needed (pain).   omeprazole 20 MG capsule Commonly known as:  PRILOSEC Take 20 mg by mouth daily.   oxyCODONE 5 MG/5ML solution Commonly known as:  ROXICODONE Take 5-10 mLs (5-10 mg total) by mouth every 6 (six) hours as needed for moderate pain or severe pain.   PREPARATION H EX Apply 1 application topically daily as needed (hemorrhoids).   pseudoephedrine 30 MG tablet Commonly known as:  SUDAFED Take 60 mg by mouth daily as needed for congestion.   simethicone 125 MG chewable tablet Commonly known as:  MYLICON Chew 643 mg by mouth as needed for flatulence.   SYSTANE OP  Place 1 drop into both eyes daily as needed (dry eyes).   VITAMIN C PO Take 1 tablet by mouth 2 (two) times a week.      Follow-up Information    [name], MD Follow up in 2 week(s).   Specialty:  General Surgery Contact information: [address] [place] [address] [phone]           Signed: [name] 09/29/2017, 6:22 AM

## 2017-09-29 NOTE — Care Management Note (Addendum)
Case Management Note  Patient Details  Name: MIKKA KISSNER MRN: [id number] Date of Birth: 1955-09-05  Subjective/Objective:                    Action/Plan: Patient called she is at [facility] at [address] [employer] [address]. Pharmacy does not have medication in full in stock. NCM spoke with pharmacist. [facility] on [address] has medication in full in stock. Patient agreeable and able to go there. Pharmacy unable to forward prescription.   Spoke with Brigid Re she will send new prescription to [facility].   Expected Discharge Date:  09/29/17               Expected Discharge Plan:  Home/Self Care  In-House Referral:  Financial Counselor  Discharge planning Services  CM Consult, Medication Assistance, [facility] Program  Post Acute Care Choice:  NA Choice offered to:  Patient  DME Arranged:  N/A DME Agency:  NA  HH Arranged:  NA HH Agency:  NA  Status of Service:  Completed, signed off  If discussed at [place] of Stay Meetings, dates discussed:    Additional Comments:  Marilu Favre, RN 09/29/2017, 8:36 AM

## 2017-09-30 ENCOUNTER — Other Ambulatory Visit: Payer: Self-pay | Admitting: General Surgery

## 2017-09-30 ENCOUNTER — Ambulatory Visit
Admission: RE | Admit: 2017-09-30 | Discharge: 2017-09-30 | Disposition: A | Discharge status: Home / Self Care | Payer: Self-pay | Source: Ambulatory Visit | Attending: General Surgery | Admitting: General Surgery

## 2017-09-30 DIAGNOSIS — R0602 Shortness of breath: Secondary | ICD-10-CM

## 2017-09-30 LAB — BPAM RBC
BLOOD PRODUCT EXPIRATION DATE: 201906012359
Blood Product Expiration Date: 201905302359
ISSUE DATE / TIME: 201905140852
ISSUE DATE / TIME: 201905140852
UNIT TYPE AND RH: 7300
Unit Type and Rh: 7300

## 2017-09-30 LAB — TYPE AND SCREEN
ABO/RH(D): B POS
ANTIBODY SCREEN: NEGATIVE
UNIT DIVISION: 0
Unit division: 0

## 2017-10-04 ENCOUNTER — Other Ambulatory Visit (HOSPITAL_COMMUNITY): Payer: Self-pay | Admitting: General Surgery

## 2017-10-04 ENCOUNTER — Encounter (HOSPITAL_COMMUNITY): Payer: Self-pay

## 2017-10-04 ENCOUNTER — Other Ambulatory Visit: Payer: Self-pay | Admitting: Student

## 2017-10-04 ENCOUNTER — Ambulatory Visit (HOSPITAL_COMMUNITY)
Admission: RE | Admit: 2017-10-04 | Discharge: 2017-10-04 | Disposition: A | Discharge status: Home / Self Care | Payer: Self-pay | Source: Ambulatory Visit | Attending: General Surgery | Admitting: General Surgery

## 2017-10-04 DIAGNOSIS — R0602 Shortness of breath: Secondary | ICD-10-CM | POA: Insufficient documentation

## 2017-10-04 DIAGNOSIS — J9811 Atelectasis: Secondary | ICD-10-CM | POA: Insufficient documentation

## 2017-10-04 DIAGNOSIS — I2510 Atherosclerotic heart disease of native coronary artery without angina pectoris: Secondary | ICD-10-CM | POA: Insufficient documentation

## 2017-10-04 DIAGNOSIS — Z9049 Acquired absence of other specified parts of digestive tract: Secondary | ICD-10-CM | POA: Insufficient documentation

## 2017-10-04 DIAGNOSIS — K651 Peritoneal abscess: Secondary | ICD-10-CM | POA: Insufficient documentation

## 2017-10-04 MED ORDER — IOPAMIDOL (ISOVUE-370) INJECTION 76%
INTRAVENOUS | Status: AC
  Administered 2017-10-04: 100 mL
  Filled 2017-10-04: qty 100

## 2017-10-05 ENCOUNTER — Other Ambulatory Visit (HOSPITAL_COMMUNITY): Payer: Self-pay | Admitting: General Surgery

## 2017-10-05 DIAGNOSIS — K651 Peritoneal abscess: Secondary | ICD-10-CM

## 2017-10-07 ENCOUNTER — Other Ambulatory Visit: Payer: Self-pay | Admitting: Radiology

## 2017-10-07 ENCOUNTER — Encounter (HOSPITAL_COMMUNITY): Payer: Self-pay

## 2017-10-07 ENCOUNTER — Ambulatory Visit (HOSPITAL_COMMUNITY)
Admission: RE | Admit: 2017-10-07 | Discharge: 2017-10-07 | Disposition: A | Discharge status: Home / Self Care | Payer: Self-pay | Source: Ambulatory Visit | Attending: General Surgery | Admitting: General Surgery

## 2017-10-07 DIAGNOSIS — Z79899 Other long term (current) drug therapy: Secondary | ICD-10-CM | POA: Insufficient documentation

## 2017-10-07 DIAGNOSIS — K219 Gastro-esophageal reflux disease without esophagitis: Secondary | ICD-10-CM | POA: Insufficient documentation

## 2017-10-07 DIAGNOSIS — D509 Iron deficiency anemia, unspecified: Secondary | ICD-10-CM | POA: Insufficient documentation

## 2017-10-07 DIAGNOSIS — K651 Peritoneal abscess: Secondary | ICD-10-CM | POA: Insufficient documentation

## 2017-10-07 LAB — CBC
HEMATOCRIT: 30.3 % — AB (ref 36.0–46.0)
HEMOGLOBIN: 9.2 g/dL — AB (ref 12.0–15.0)
MCH: 24.7 pg — ABNORMAL LOW (ref 26.0–34.0)
MCHC: 30.4 g/dL (ref 30.0–36.0)
MCV: 81.5 fL (ref 78.0–100.0)
Platelets: 476 10*3/uL — ABNORMAL HIGH (ref 150–400)
RBC: 3.72 MIL/uL — ABNORMAL LOW (ref 3.87–5.11)
RDW: 14.8 % (ref 11.5–15.5)
WBC: 7.4 10*3/uL (ref 4.0–10.5)

## 2017-10-07 LAB — PROTIME-INR
INR: 1.1
Prothrombin Time: 14.2 seconds (ref 11.4–15.2)

## 2017-10-07 MED ORDER — SODIUM CHLORIDE 0.9% FLUSH: 5.0000 mL | Freq: Three times a day (TID) | INTRAVENOUS | Status: DC

## 2017-10-07 MED ORDER — CEFAZOLIN SODIUM-DEXTROSE 2-4 GM/100ML-% IV SOLN
INTRAVENOUS | Status: AC
  Filled 2017-10-07: qty 100

## 2017-10-07 MED ORDER — LIDOCAINE HCL 1 % IJ SOLN
INTRAMUSCULAR | Status: AC
  Filled 2017-10-07: qty 20

## 2017-10-07 MED ORDER — FENTANYL CITRATE (PF) 100 MCG/2ML IJ SOLN
INTRAMUSCULAR | Status: AC | PRN
  Administered 2017-10-07 (×2): 50 ug via INTRAVENOUS
  Administered 2017-10-07 (×2): 25 ug via INTRAVENOUS

## 2017-10-07 MED ORDER — FENTANYL CITRATE (PF) 100 MCG/2ML IJ SOLN
INTRAMUSCULAR | Status: AC
  Filled 2017-10-07: qty 4

## 2017-10-07 MED ORDER — MIDAZOLAM HCL 2 MG/2ML IJ SOLN
INTRAMUSCULAR | Status: AC
  Filled 2017-10-07: qty 4

## 2017-10-07 MED ORDER — MIDAZOLAM HCL 2 MG/2ML IJ SOLN
INTRAMUSCULAR | Status: AC | PRN
  Administered 2017-10-07: 0.5 mg via INTRAVENOUS
  Administered 2017-10-07: 1 mg via INTRAVENOUS
  Administered 2017-10-07: 0.5 mg via INTRAVENOUS

## 2017-10-07 MED ORDER — CEFAZOLIN SODIUM-DEXTROSE 2-4 GM/100ML-% IV SOLN
2.0000 g | INTRAVENOUS | Status: AC
  Administered 2017-10-07: 2 g via INTRAVENOUS
  Filled 2017-10-07: qty 100

## 2017-10-07 NOTE — Procedures (Signed)
Interventional Radiology Procedure Note  Procedure: CT guided abdominal drain, 59F.    Findings:  ~320cc of dark sero-sanguinous fluid aspirated.    Complications: None  Recommendations:  - To gravity drain - Routine drain care - Dc in 1 hour - Do not submerge - follow up culture  Signed,  Dulcy Fanny. Earleen Newport, DO

## 2017-10-07 NOTE — H&P (Signed)
Chief Complaint: Patient was seen in consultation today for drainage of right subphrenic abscess at the request of [place]  Referring Physician(s): [place]  Supervising Physician: Corrie Mckusick  Patient Status: [facility] - Out-pt  History of Present Illness: [name] is a 62 y.o. female who underwent laparoscopic liver cyst unroofing by Dr. Barry Dienes on 5/14 . She's developed some post op pain and new CT scan shows large mixed air/fluid right subphrenic abscess. IR is asked to place perc drain. PMHx, meds, labs, imaging, allergies reviewed. Feels well otherwise. Has been NPO appropriately today. Family at bedside.   Past Medical History:  Diagnosis Date  . Anemia    iron deficiency anemia  . 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 Dyspnea    due to liver cyst  . GERD (gastroesophageal reflux disease)   . Headache   . History of kidney stones     Past Surgical History:  Procedure Laterality Date  . ABDOMINAL HYSTERECTOMY    . BREAST EXCISIONAL BIOPSY Left   . CHOLECYSTECTOMY    . COLPOSCOPY    . LAPAROSCOPIC LIVER CYST REMOVAL N/A 09/27/2017   Procedure: LAPAROSCOPIC LIVER CYST UNROOFING;  Surgeon: [name], MD;  Location: [place];  Service: General;  Laterality: N/A;  . WISDOM TOOTH EXTRACTION      Allergies: Patient has no known allergies.  Medications: Prior to Admission medications   Medication Sig Start Date End Date Taking? Authorizing Provider  Ascorbic Acid (VITAMIN C PO) Take 1 tablet by mouth 2 (two) times a week.   Yes [provider]  Calcium Carbonate-Vitamin D (CALCIUM-D PO) Take 1 tablet by mouth 2 (two) times a week.   Yes [provider]  estradiol (VIVELLE-DOT) 0.0375 MG/24HR Place 1 patch onto the skin once a week.   Yes [provider]  ferrous sulfate 220 (44 Fe) MG/5ML solution Take 330 mg by mouth every Monday, Wednesday, and Friday.   Yes [provider]    Multiple Vitamin (MULTIVITAMIN WITH MINERALS) TABS tablet Take 1 tablet by mouth 2 (two) times a week.   Yes [provider]  naproxen sodium (ALEVE) 220 MG tablet Take 220-440 mg by mouth daily as needed (pain).   Yes [provider]  omeprazole (PRILOSEC) 20 MG capsule Take 20 mg by mouth daily.   Yes [provider]  oxyCODONE (ROXICODONE) 5 MG/5ML solution Take 5-10 mLs (5-10 mg total) by mouth every 6 (six) hours as needed for severe pain. 09/29/17  Yes Meuth, Brooke A, PA-C  acetaminophen (TYLENOL) 160 MG/5ML liquid Take 325-500 mg by mouth daily as needed for pain.    [provider]  Polyethyl Glycol-Propyl Glycol (SYSTANE OP) Place 1 drop into both eyes daily as needed (dry eyes).    [provider]  pseudoephedrine (SUDAFED) 30 MG tablet Take 60 mg by mouth daily as needed for congestion.    [provider]  simethicone (MYLICON) 163 MG chewable tablet Chew 125 mg by mouth as needed for flatulence.    [provider]  Verlee Monte (PREPARATION H EX) Apply 1 application topically daily as needed (hemorrhoids).    [provider]     Family History  Problem Relation Age of Onset  . Hypertension Mother   . Hypertension Father   . Hypertension Brother   . Breast cancer Paternal Aunt   . Diabetes Maternal Grandmother     Social History   Socioeconomic History  .  Marital status: Single    Spouse name: Not on file  . Number of children: Not on file  . Years of education: Not on file  . Highest education level: Not on file  Occupational History  . Not on file  Social Needs  . Financial resource strain: Not on file  . Food insecurity:    Worry: Not on file    Inability: Not on file  . Transportation needs:    Medical: Not on file    Non-medical: Not on file  Tobacco Use  . Smoking status: Never Smoker  . Smokeless tobacco: Never Used  Substance and Sexual Activity  . Alcohol use: No  . Drug use: No  .  Sexual activity: Not on file  Lifestyle  . Physical activity:    Days per week: Not on file    Minutes per session: Not on file  . Stress: Not on file  Relationships  . Social connections:    Talks on phone: Not on file    Gets together: Not on file    Attends religious service: Not on file    Active member of club or organization: Not on file    Attends meetings of clubs or organizations: Not on file    Relationship status: Not on file  Other Topics Concern  . Not on file  Social History Narrative  . Not on file     Review of Systems: A 12 point ROS discussed and pertinent positives are indicated in the HPI above.  All other systems are negative.  Review of Systems  Vital Signs: BP 123/78   Pulse 96   Temp 98.7 F (37.1 C)   Resp 18   Ht 5\' 3"  (1.6 m)   Wt 161 lb (73 kg)   SpO2 100%   BMI 28.52 kg/m   Physical Exam  Constitutional: She is oriented to person, place, and time. She appears well-developed. No distress.  HENT:  Head: Normocephalic.  Mouth/Throat: Oropharynx is clear and moist.  Neck: Normal range of motion. No JVD present. No tracheal deviation present.  Cardiovascular: Normal rate, regular rhythm and normal heart sounds.  Pulmonary/Chest: Effort normal and breath sounds normal. No respiratory distress.  Abdominal: Soft. She exhibits no distension.  Mild RUQ tenderness, no rebound  Neurological: She is alert and oriented to person, place, and time.  Skin: Skin is warm and dry.  Psychiatric: She has a normal mood and affect.     Imaging: Dg Chest 2 View  Result Date: 10/03/2017 CLINICAL DATA:  Shortness of breath. EXAM: CHEST - 2 VIEW COMPARISON:  None. FINDINGS: The heart size and mediastinal contours are within normal limits. Both lungs are clear. No pneumothorax or pleural effusion is noted. Elevated right hemidiaphragm is noted. The visualized skeletal structures are unremarkable. IMPRESSION: No active cardiopulmonary disease. Electronically  Signed   By: Marijo Conception, M.D.   On: 10/03/2017 08:03   Ct Angio Chest Pe W Or Wo Contrast  Result Date: 10/04/2017 CLINICAL DATA:  Shortness of breath EXAM: CT ANGIOGRAPHY CHEST WITH CONTRAST TECHNIQUE: Multidetector CT imaging of the chest was performed using the standard protocol during bolus administration of intravenous contrast. Multiplanar CT image reconstructions and MIPs were obtained to evaluate the vascular anatomy. CONTRAST:  173mL ISOVUE-370 IOPAMIDOL (ISOVUE-370) INJECTION 76% COMPARISON:  Chest radiograph Sep 30, 2017 FINDINGS: Cardiovascular: There is no demonstrable pulmonary embolus. There is no thoracic aortic aneurysm or dissection. Visualized great vessels appear unremarkable. There are foci of coronary  artery calcification. There is no pericardial effusion or pericardial thickening. Mediastinum/Nodes: Visualized thyroid appears normal. There is no appreciable thoracic adenopathy. No esophageal lesions are evident. Lungs/Pleura: There is atelectatic change in the right lung base. There is mild consolidation in the posterior segment right lower lobe. The lungs elsewhere clear. No pleural effusion or pleural thickening evident. Upper Abdomen: There is elevation of the left hemidiaphragm. There is an apparent subphrenic abscess on the right with fluid, ill-defined solid material, and air within this complex structure. This lesion measures 14.3 x 10.6 x 10.3 cm. Gallbladder is absent. Visualized upper abdominal structures otherwise appear normal. Musculoskeletal: There are no blastic or lytic bone lesions. No chest wall lesions are evident. Review of the MIP images confirms the above findings. IMPRESSION: 1. Right subphrenic abscess measuring 14.3 x 10.6 x 10.3 cm. There is elevation of the right hemidiaphragm. 2.  Atelectasis and patchy consolidation posterior right lung base. 3. No demonstrable pulmonary embolus. No thoracic aortic aneurysm or dissection. 4.  There are foci of coronary  artery calcification. 5.  No appreciable thoracic adenopathy. 6.  Gallbladder absent. Critical Value/emergent results were called by telephone at the time of interpretation on 10/04/2017 at 4:11 pm to Carlena Hurl, PA, who verbally acknowledged these results. Electronically Signed   By: Lowella Grip III M.D.   On: 10/04/2017 16:13    Labs:  CBC: Recent Labs    09/26/17 1040 09/28/17 0444 09/29/17 0511  WBC 3.3* 8.0 7.1  HGB 11.2* 8.1* 7.8*  HCT 35.8* 26.8* 26.0*  PLT 270 211 181    COAGS: Recent Labs    09/26/17 1040  INR 1.04    BMP: Recent Labs    09/26/17 1040 09/28/17 0444 09/29/17 0511  NA 139 135 137  K 3.9 4.2 4.1  CL 105 103 105  CO2 26 25 26   GLUCOSE 91 129* 106*  BUN 8 7 7   CALCIUM 9.7 8.4* 8.3*  CREATININE 1.04* 0.87 0.97  GFRNONAA 57* >60 >60  GFRAA >60 >60 >60    LIVER FUNCTION TESTS: Recent Labs    09/26/17 1040 09/28/17 0444 09/29/17 0511  BILITOT 0.3 0.2* 0.4  AST 23 86* 44*  ALT 23 102* 65*  ALKPHOS 64 55 52  PROT 7.4 5.4* 5.3*  ALBUMIN 3.7 2.8* 2.7*    TUMOR MARKERS: No results for input(s): AFPTM, CEA, CA199, CHROMGRNA in the last 8760 hours.  Assessment and Plan: Right subphrenic/perihepatic abscess following Lap liver cyst unroofing 2 weeks ago. Plan for CT guided drainage Labs pending Risks and benefits discussed with the patient including bleeding, infection, damage to adjacent structures, bowel perforation/fistula connection, and sepsis.  All of the patient's questions were answered, patient is agreeable to proceed. Consent signed and in chart.    Thank you for this interesting consult.  I greatly enjoyed meeting TIFFANYE HARTMANN and look forward to participating in their care.  A copy of this report was sent to the requesting provider on this date.  Electronically Signed: Ascencion Dike, PA-C 10/07/2017, 1:37 PM   I spent a total of 20 minutes in face to face in clinical consultation, greater than 50% of which  was counseling/coordinating care for subphrenic abscess drainage

## 2017-10-07 NOTE — Discharge Instructions (Signed)
Percutaneous Abscess Drain, Care After °This sheet gives you information about how to care for yourself after your procedure. Your health care provider may also give you more specific instructions. If you have problems or questions, contact your health care provider. °What can I expect after the procedure? °After your procedure, it is common to have: °· A small amount of bruising and discomfort in the area where the drainage tube (catheter) was placed. °· Sleepiness and fatigue. This should go away after the medicines you were given have worn off. ° °Follow these instructions at home: °Incision care °· Follow instructions from your health care provider about how to take care of your incision. Make sure you: °? Wash your hands with soap and water before you change your bandage (dressing). If soap and water are not available, use hand sanitizer. °? Change your dressing as told by your health care provider. °? Leave stitches (sutures), skin glue, or adhesive strips in place. These skin closures may need to stay in place for 2 weeks or longer. If adhesive strip edges start to loosen and curl up, you may trim the loose edges. Do not remove adhesive strips completely unless your health care provider tells you to do that. °· Check your incision area every day for signs of infection. Check for: °? More redness, swelling, or pain. °? More fluid or blood. °? Warmth. °? Pus or a bad smell. °? Fluid leaking from around your catheter (instead of fluid draining through your catheter). °Catheter care °· Follow instructions from your health care provider about emptying and cleaning your catheter and collection bag. You may need to clean the catheter every day so it does not clog. °· If directed, write down the following information every time you empty your bag: °? The date and time. °? The amount of drainage. °General instructions °· Rest at home for 1-2 days after your procedure. Return to your normal activities as told by your  health care provider. °· Do not take baths, swim, or use a hot tub for 24 hours after your procedure, or until your health care provider says that this is okay. °· Take over-the-counter and prescription medicines only as told by your health care provider. °· Keep all follow-up visits as told by your health care provider. This is important. °Contact a health care provider if: °· You have less than 10 mL of drainage a day for 2-3 days in a row, or as directed by your health care provider. °· You have more redness, swelling, or pain around your incision area. °· You have more fluid or blood coming from your incision area. °· Your incision area feels warm to the touch. °· You have pus or a bad smell coming from your incision area. °· You have fluid leaking from around your catheter (instead of through your catheter). °· You have a fever or chills. °· You have pain that does not get better with medicine. °Get help right away if: °· Your catheter comes out. °· You suddenly stop having drainage from your catheter. °· You suddenly have blood in the fluid that is draining from your catheter. °· You become dizzy or you faint. °· You develop a rash. °· You have nausea or vomiting. °· You have difficulty breathing or you feel short of breath. °· You develop chest pain. °· You have problems with your speech or vision. °· You have trouble balancing or moving your arms or legs. °Summary °· It is common to have a small   amount of bruising and discomfort in the area where the drainage tube (catheter) was placed. °· You may be directed to record the amount of drainage from the bag every time you empty it. °· Follow instructions from your health care provider about emptying and cleaning your catheter and collection bag. °This information is not intended to replace advice given to you by your health care provider. Make sure you discuss any questions you have with your health care provider. °Document Released: 09/17/2013 Document  Revised: 03/25/2016 Document Reviewed: 03/25/2016 °Elsevier Interactive Patient Education © 2017 [employer] ° °

## 2017-10-11 ENCOUNTER — Ambulatory Visit (HOSPITAL_COMMUNITY): Payer: Self-pay

## 2017-10-13 ENCOUNTER — Ambulatory Visit (HOSPITAL_COMMUNITY): Payer: Self-pay

## 2017-10-13 LAB — AEROBIC/ANAEROBIC CULTURE W GRAM STAIN (SURGICAL/DEEP WOUND)

## 2017-10-13 LAB — AEROBIC/ANAEROBIC CULTURE (SURGICAL/DEEP WOUND): CULTURE: NO GROWTH

## 2017-10-18 ENCOUNTER — Ambulatory Visit (HOSPITAL_COMMUNITY): Payer: Self-pay

## 2017-10-18 ENCOUNTER — Encounter (HOSPITAL_COMMUNITY): Payer: Self-pay

## 2017-10-30 ENCOUNTER — Encounter (HOSPITAL_COMMUNITY): Payer: Self-pay | Admitting: Emergency Medicine

## 2017-10-30 ENCOUNTER — Other Ambulatory Visit: Payer: Self-pay

## 2017-10-30 ENCOUNTER — Emergency Department (HOSPITAL_COMMUNITY)
Admission: EM | Admit: 2017-10-30 | Discharge: 2017-10-31 | Disposition: A | Discharge status: Home / Self Care | Payer: Self-pay | Attending: Emergency Medicine | Admitting: Emergency Medicine

## 2017-10-30 ENCOUNTER — Emergency Department (HOSPITAL_COMMUNITY): Payer: Self-pay

## 2017-10-30 DIAGNOSIS — N201 Calculus of ureter: Secondary | ICD-10-CM | POA: Insufficient documentation

## 2017-10-30 DIAGNOSIS — N23 Unspecified renal colic: Secondary | ICD-10-CM | POA: Insufficient documentation

## 2017-10-30 DIAGNOSIS — Z79899 Other long term (current) drug therapy: Secondary | ICD-10-CM | POA: Insufficient documentation

## 2017-10-30 LAB — COMPREHENSIVE METABOLIC PANEL
ALBUMIN: 3.6 g/dL (ref 3.5–5.0)
ALT: 56 U/L — AB (ref 14–54)
AST: 53 U/L — AB (ref 15–41)
Alkaline Phosphatase: 116 U/L (ref 38–126)
Anion gap: 10 (ref 5–15)
BILIRUBIN TOTAL: 0.3 mg/dL (ref 0.3–1.2)
BUN: 13 mg/dL (ref 6–20)
CHLORIDE: 104 mmol/L (ref 101–111)
CO2: 22 mmol/L (ref 22–32)
CREATININE: 1.12 mg/dL — AB (ref 0.44–1.00)
Calcium: 9.2 mg/dL (ref 8.9–10.3)
GFR calc Af Amer: >60 mL/min (ref >60)
GFR, EST NON AFRICAN AMERICAN: 52 mL/min — AB (ref >60)
GLUCOSE: 143 mg/dL — AB (ref 65–99)
POTASSIUM: 3.8 mmol/L (ref 3.5–5.1)
Sodium: 136 mmol/L (ref 135–145)
Total Protein: 7.3 g/dL (ref 6.5–8.1)

## 2017-10-30 LAB — CBC
HEMATOCRIT: 31.9 % — AB (ref 36.0–46.0)
Hemoglobin: 9.4 g/dL — ABNORMAL LOW (ref 12.0–15.0)
MCH: 24.4 pg — AB (ref 26.0–34.0)
MCHC: 29.5 g/dL — AB (ref 30.0–36.0)
MCV: 82.9 fL (ref 78.0–100.0)
PLATELETS: 279 10*3/uL (ref 150–400)
RBC: 3.85 MIL/uL — ABNORMAL LOW (ref 3.87–5.11)
RDW: 15.6 % — AB (ref 11.5–15.5)
WBC: 7.6 10*3/uL (ref 4.0–10.5)

## 2017-10-30 LAB — LIPASE, BLOOD: LIPASE: 50 U/L (ref 11–51)

## 2017-10-30 LAB — I-STAT CG4 LACTIC ACID, ED: Lactic Acid, Venous: 1.37 mmol/L (ref 0.5–1.9)

## 2017-10-30 LAB — I-STAT BETA HCG BLOOD, ED (MC, WL, AP ONLY): I-stat hCG, quantitative: <5 m[IU]/mL (ref <5)

## 2017-10-30 MED ORDER — MORPHINE SULFATE (PF) 4 MG/ML IV SOLN
4.0000 mg | Freq: Once | INTRAVENOUS | Status: AC
  Administered 2017-10-31: 4 mg via INTRAVENOUS
  Filled 2017-10-30: qty 1

## 2017-10-30 NOTE — ED Notes (Signed)
Patient transported to X-ray 

## 2017-10-30 NOTE — ED Triage Notes (Signed)
Pt reports RLQ pain onset 1900, denies N/V/D. Pt has extensive GI hx, had liver cyst removed laparoscopically 5/14. Pt had drain removed this past Friday. Low grade fever in triage.

## 2017-10-30 NOTE — ED Provider Notes (Signed)
[facility] EMERGENCY DEPARTMENT Provider Note   CSN: [id number] Arrival date & time: 10/30/17  2149     History   Chief Complaint Chief Complaint  Patient presents with  . Abdominal Pain    HPI [name] is a 62 y.o. female presenting for evaluation of abdominal pain.  Patient states she developed severe sharp right upper quadrant abdominal pain at 720 this evening.  It is been constant since.  She took some Gas-X, but had no improvement of symptoms.  There is no improvement with having a bowel movement.  She states the pain is radiating towards her back.  She is concerned, because she recently had a cyst removed from her liver with a drain placement, which was removed 2 days ago.  She denies fevers at home, but had a temperature upon arrival to the ED.  She denies chest pain, shortness of breath, nausea, vomiting, abdominal urination.  She has had multiple bowel movements today, which she states is abnormal.  She has not had anything for pain today including Tylenol or ibuprofen.  Patient states pain is constant, with intermittent sharp spasms.  Nothing makes it better including positioning, urination, or bowel movements.  Patient's other abdominal surgeries include hysterectomy and cholecystectomy.   HPI  Past Medical History:  Diagnosis Date  . Anemia    iron deficiency anemia  . Anginal pain ([place])    a year or more, due to costochrondritis  . Dyspnea    due to liver cyst  . GERD (gastroesophageal reflux disease)   . Headache   . History of kidney stones     Patient Active Problem List   Diagnosis Date Noted  . 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 ABDOMINAL HYSTERECTOMY    . BREAST EXCISIONAL BIOPSY Left   . CHOLECYSTECTOMY    . COLPOSCOPY    . LAPAROSCOPIC LIVER CYST REMOVAL N/A 09/27/2017   Procedure: LAPAROSCOPIC LIVER CYST UNROOFING;  Surgeon: [name], MD;  Location: [place];  Service:  General;  Laterality: N/A;  . WISDOM TOOTH EXTRACTION       OB History    Gravida  0   Para  0   Term  0   Preterm  0   AB  0   Living  0     SAB  0   TAB  0   Ectopic  0   Multiple  0   Live Births  0            Home Medications    Prior to Admission medications   Medication Sig Start Date End Date Taking? Authorizing Provider  Ascorbic Acid (VITAMIN C PO) Take 1 tablet by mouth daily.    Yes [provider]  Calcium Carbonate-Vitamin D (CALCIUM-D PO) Take 1 tablet by mouth daily.    Yes [provider]  estradiol (VIVELLE-DOT) 0.0375 MG/24HR Place 1 patch onto the skin once a week.   Yes [provider]  ferrous sulfate 220 (44 Fe) MG/5ML solution Take 330 mg by mouth daily with breakfast.    Yes [provider]  Multiple Vitamin (MULTIVITAMIN WITH MINERALS) TABS tablet Take 1 tablet by mouth daily.    Yes [provider]  omeprazole (PRILOSEC) 20 MG capsule Take 20 mg by mouth daily.   Yes [provider]  oxyCODONE (ROXICODONE) 5 MG/5ML solution Take 5-10 mLs (5-10 mg total) by mouth every 6 (six) hours  as needed for severe pain. Patient not taking: Reported on 10/30/2017 09/29/17   Wellington Hampshire, PA-C    Family History Family History  Problem Relation Age of Onset  . Hypertension Mother   . Hypertension Father   . Hypertension Brother   . Breast cancer Paternal Aunt   . Diabetes Maternal Grandmother     Social History Social History   Tobacco Use  . Smoking status: Never Smoker  . Smokeless tobacco: Never Used  Substance Use Topics  . Alcohol use: No  . Drug use: No     Allergies   Patient has no known allergies.   Review of Systems Review of Systems  Gastrointestinal: Positive for abdominal pain and diarrhea.  All other systems reviewed and are negative.    Physical Exam Updated Vital Signs BP 131/82 (BP Location: Left Arm)   Pulse 98   Temp (!) 100.6 F (38.1 C) (Oral)    Resp (!) 22   Ht 5\' 3"  (1.6 m)   Wt 72.6 kg (160 lb)   SpO2 98%   BMI 28.34 kg/m   Physical Exam  Constitutional: She is oriented to person, place, and time. She appears well-developed and well-nourished. No distress.  Patient appears uncomfortable due to pain, but in no distress  HENT:  Head: Normocephalic and atraumatic.  Eyes: Pupils are equal, round, and reactive to light. Conjunctivae and EOM are normal.  Neck: Normal range of motion. Neck supple.  Cardiovascular: Normal rate, regular rhythm and intact distal pulses.  Pulmonary/Chest: Effort normal and breath sounds normal. No respiratory distress. She has no wheezes.  Patient speaking in short sentences, taking shallow breaths due to pain.  Clear lung sounds in all fields  Abdominal: Soft. She exhibits no distension and no mass. There is tenderness. There is no rebound.  Tenderness palpation of right upper and epigastric abdomen. Voluntary guarding.  No CVA tenderness.  No tenderness palpation of the rest of the abdomen.  Soft without rigidity or distention.  Musculoskeletal: Normal range of motion.  Neurological: She is alert and oriented to person, place, and time.  Skin: Skin is warm and dry.  Psychiatric: She has a normal mood and affect.  Nursing note and vitals reviewed.    ED Treatments / Results  Labs (all labs ordered are listed, but only abnormal results are displayed) Labs Reviewed  COMPREHENSIVE METABOLIC PANEL - Abnormal; Notable for the following components:      Result Value   Glucose, Bld 143 (*)    Creatinine, Ser 1.12 (*)    AST 53 (*)    ALT 56 (*)    GFR calc non Af Amer 52 (*)    All other components within normal limits  CBC - Abnormal; Notable for the following components:   RBC 3.85 (*)    Hemoglobin 9.4 (*)    HCT 31.9 (*)    MCH 24.4 (*)    MCHC 29.5 (*)    RDW 15.6 (*)    All other components within normal limits  LIPASE, BLOOD  URINALYSIS, ROUTINE W REFLEX MICROSCOPIC  I-STAT BETA HCG  BLOOD, ED ([facility], [facility], [facility] ONLY)  I-STAT CG4 LACTIC ACID, ED  I-STAT CG4 LACTIC ACID, ED    EKG None  Radiology No results found.  Procedures Procedures (including critical care time)  Medications Ordered in ED Medications  morphine 4 MG/ML injection 4 mg (has no administration in time range)     Initial Impression / Assessment and Plan / ED Course  I have reviewed the triage vital signs and the nursing notes.  Pertinent labs & imaging results that were available during my care of the patient were reviewed by me and considered in my medical decision making (see chart for details).     Patient presenting for evaluation of acute onset right upper quadrant abdominal pain this evening.  Physical exam shows patient who appears very uncomfortable due to pain, but in no acute distress.  She had a mild fever of 100.6 upon arrival to the ED. She is not tachycardic not hypotensive.  Doubt sepsis.  Labs reassuring, no leukocytosis.  Lactic normal.  Creatinine, liver function, and lipase reassuring.  Liver enzymes slightly elevated, although this appears baseline and recently had surgery, this is to be expected.  Will obtain chest x-ray and KUB to assess for free air or infection.  CT abdomen for further evaluation.  xrays reviewed and interpreted by me, no obvious signs of infection or free air.  CT abdomen pending.   Patient signed out to Ermalinda Memos, PA-C for follow-up on CT.  Plan for discharge if CT is normal.  Consult with surgery of CT is abnormal.  Discussed with patient, who is agreeable to plan.  Patient to follow-up with surgery tomorrow if CT is normal and symptoms are not improving.  Final Clinical Impressions(s) / ED Diagnoses   Final diagnoses:  None    ED Discharge Orders    None       Franchot Heidelberg, PA-C 10/31/17 0144    Orpah Greek, MD 10/31/17 [phone]

## 2017-10-31 ENCOUNTER — Encounter (HOSPITAL_COMMUNITY)
Admission: RE | Disposition: A | Discharge status: Home / Self Care | Payer: Self-pay | Source: Ambulatory Visit | Attending: Urology

## 2017-10-31 ENCOUNTER — Ambulatory Visit (HOSPITAL_COMMUNITY)
Admission: RE | Admit: 2017-10-31 | Discharge: 2017-10-31 | Disposition: A | Discharge status: Home / Self Care | Payer: Self-pay | Source: Ambulatory Visit | Attending: Urology | Admitting: Urology

## 2017-10-31 ENCOUNTER — Ambulatory Visit (HOSPITAL_COMMUNITY): Payer: Self-pay

## 2017-10-31 ENCOUNTER — Other Ambulatory Visit: Payer: Self-pay | Admitting: Urology

## 2017-10-31 ENCOUNTER — Encounter (HOSPITAL_COMMUNITY): Payer: Self-pay | Admitting: General Practice

## 2017-10-31 ENCOUNTER — Emergency Department (HOSPITAL_COMMUNITY): Payer: Self-pay

## 2017-10-31 DIAGNOSIS — N132 Hydronephrosis with renal and ureteral calculous obstruction: Secondary | ICD-10-CM | POA: Insufficient documentation

## 2017-10-31 DIAGNOSIS — N201 Calculus of ureter: Secondary | ICD-10-CM

## 2017-10-31 DIAGNOSIS — Z87442 Personal history of urinary calculi: Secondary | ICD-10-CM | POA: Insufficient documentation

## 2017-10-31 DIAGNOSIS — K219 Gastro-esophageal reflux disease without esophagitis: Secondary | ICD-10-CM | POA: Insufficient documentation

## 2017-10-31 HISTORY — PX: EXTRACORPOREAL SHOCK WAVE LITHOTRIPSY: SHX1557

## 2017-10-31 LAB — URINALYSIS, ROUTINE W REFLEX MICROSCOPIC
Bilirubin Urine: NEGATIVE
Glucose, UA: NEGATIVE mg/dL
Ketones, ur: 5 mg/dL — AB
Leukocytes, UA: NEGATIVE
Nitrite: NEGATIVE
PH: 8 (ref 5.0–8.0)
Protein, ur: NEGATIVE mg/dL
RBC / HPF: >50 RBC/hpf — ABNORMAL HIGH (ref 0–5)

## 2017-10-31 SURGERY — LITHOTRIPSY, ESWL
Anesthesia: LOCAL | Laterality: Right

## 2017-10-31 MED ORDER — FENTANYL CITRATE (PF) 100 MCG/2ML IJ SOLN
100.0000 ug | Freq: Once | INTRAMUSCULAR | Status: AC
Start: 2017-10-31 — End: 2017-10-31
  Administered 2017-10-31: 100 ug via INTRAVENOUS
  Filled 2017-10-31: qty 2

## 2017-10-31 MED ORDER — CEPHALEXIN 250 MG/5ML PO SUSR: 250.0000 mg | Freq: Three times a day (TID) | ORAL | 0 refills | Status: DC

## 2017-10-31 MED ORDER — DIPHENHYDRAMINE HCL 25 MG PO CAPS
25.0000 mg | ORAL_CAPSULE | ORAL | Status: DC
  Filled 2017-10-31: qty 1

## 2017-10-31 MED ORDER — PROMETHAZINE HCL 6.25 MG/5ML PO SYRP: 12.5000 mg | ORAL_SOLUTION | Freq: Four times a day (QID) | ORAL | 0 refills | Status: DC | PRN

## 2017-10-31 MED ORDER — CIPROFLOXACIN HCL 500 MG PO TABS
500.0000 mg | ORAL_TABLET | ORAL | Status: DC
  Filled 2017-10-31: qty 1

## 2017-10-31 MED ORDER — CIPROFLOXACIN IN D5W 400 MG/200ML IV SOLN
INTRAVENOUS | Status: AC
  Administered 2017-10-31: 400 mg via INTRAVENOUS
  Filled 2017-10-31: qty 200

## 2017-10-31 MED ORDER — CIPROFLOXACIN 250 MG/5ML (5%) PO SUSR: 500.0000 mg | Freq: Two times a day (BID) | ORAL | 0 refills | Status: AC

## 2017-10-31 MED ORDER — LORAZEPAM 2 MG/ML IJ SOLN
0.2000 mg | Freq: Once | INTRAMUSCULAR | Status: AC
  Administered 2017-10-31: 0.2 mg via INTRAVENOUS
  Filled 2017-10-31: qty 0.1

## 2017-10-31 MED ORDER — SODIUM CHLORIDE 0.9 % IV SOLN
INTRAVENOUS | Status: DC
  Administered 2017-10-31: 16:00:00 via INTRAVENOUS

## 2017-10-31 MED ORDER — HYDROMORPHONE HCL 2 MG/ML IJ SOLN
1.0000 mg | Freq: Once | INTRAMUSCULAR | Status: AC
  Administered 2017-10-31: 1 mg via INTRAVENOUS
  Filled 2017-10-31: qty 1

## 2017-10-31 MED ORDER — DIAZEPAM 5 MG PO TABS
10.0000 mg | ORAL_TABLET | ORAL | Status: DC
  Filled 2017-10-31: qty 2

## 2017-10-31 MED ORDER — HYDROCODONE-ACETAMINOPHEN 7.5-325 MG/15ML PO SOLN: 15.0000 mL | Freq: Four times a day (QID) | ORAL | 0 refills | Status: DC | PRN

## 2017-10-31 MED ORDER — CIPROFLOXACIN IN D5W 400 MG/200ML IV SOLN
400.0000 mg | Freq: Once | INTRAVENOUS | Status: AC
  Administered 2017-10-31: 400 mg via INTRAVENOUS

## 2017-10-31 MED ORDER — TAMSULOSIN HCL 0.4 MG PO CAPS: 0.4000 mg | ORAL_CAPSULE | Freq: Every day | ORAL | 0 refills | Status: DC

## 2017-10-31 MED ORDER — IOHEXOL 300 MG/ML  SOLN
100.0000 mL | Freq: Once | INTRAMUSCULAR | Status: AC | PRN
  Administered 2017-10-31: 100 mL via INTRAVENOUS

## 2017-10-31 NOTE — ED Notes (Signed)
Patient transported to CT 

## 2017-10-31 NOTE — Op Note (Signed)
[facility] Operative Note  Treating Physician: [name], MD  Pre-op diagnosis: 4.5 mm right ureteral stone  Post-op diagnosis: Same   Procedure: Right ESWL  See Aris Everts OP note scanned into chart. Also because of the size, density, location and other factors that cannot be anticipated I feel this will likely be a staged procedure. This fact supersedes any indication in the scanned [place] stone operative note to the contrary

## 2017-10-31 NOTE — ED Provider Notes (Signed)
42:23 AM 62 year old female presents to the emergency department for evaluation of sharp right upper quadrant pain which began at [employer].  Pain has been constant.  Care assumed from [facility], PA-C at change of shift with CT scan pending.  She has a history of recent hepatic cyst removal with drain placement.  Drain was removed 2 days ago.  CT scan today shows 4 mm proximal obstructing ureteral stone on the right.  This correlates with the area of the patient's pain and complaints of radiation to her back.  She is awaiting urinalysis.  On my assessment, patient still appears visibly uncomfortable.  Will give a dose of 1 mg Dilaudid.  Toradol held pending likely urologic referral.  4:06 AM Pain is significantly improved following Dilaudid.  Patient is resting comfortably.  She currently rates her pain at 1/10.  She has no evidence of urinary tract infection.  She is afebrile on repeat temperature check.  She did not receive any antipyretics.  Vitals have remained stable.  Plan for continued outpatient pain control.  The patient has already been prescribed oxycodone liquid following her recent surgical procedure.  We will add Flomax.  [facility] given [facility].  Return precautions discussed and provided. Patient discharged in stable condition with no unaddressed concerns.   Results for orders placed or performed during the hospital encounter of 10/30/17  Lipase, blood  Result Value Ref Range   Lipase 50 11 - 51 U/L  Comprehensive metabolic panel  Result Value Ref Range   Sodium 136 135 - 145 mmol/L   Potassium 3.8 3.5 - 5.1 mmol/L   Chloride 104 101 - 111 mmol/L   CO2 22 22 - 32 mmol/L   Glucose, Bld 143 (H) 65 - 99 mg/dL   BUN 13 6 - 20 mg/dL   Creatinine, Ser 1.12 (H) 0.44 - 1.00 mg/dL   Calcium 9.2 8.9 - 10.3 mg/dL   Total Protein 7.3 6.5 - 8.1 g/dL   Albumin 3.6 3.5 - 5.0 g/dL   AST 53 (H) 15 - 41 U/L   ALT 56 (H) 14 - 54 U/L   Alkaline  Phosphatase 116 38 - 126 U/L   Total Bilirubin 0.3 0.3 - 1.2 mg/dL   GFR calc non Af Amer 52 (L) >60 mL/min   GFR calc Af Amer >60 >60 mL/min   Anion gap 10 5 - 15  CBC  Result Value Ref Range   WBC 7.6 4.0 - 10.5 K/uL   RBC 3.85 (L) 3.87 - 5.11 MIL/uL   Hemoglobin 9.4 (L) 12.0 - 15.0 g/dL   HCT 31.9 (L) 36.0 - 46.0 %   MCV 82.9 78.0 - 100.0 fL   MCH 24.4 (L) 26.0 - 34.0 pg   MCHC 29.5 (L) 30.0 - 36.0 g/dL   RDW 15.6 (H) 11.5 - 15.5 %   Platelets 279 150 - 400 K/uL  Urinalysis, Routine w reflex microscopic  Result Value Ref Range   Color, Urine YELLOW YELLOW   APPearance CLEAR CLEAR   Specific Gravity, Urine >1.046 (H) 1.005 - 1.030   pH 8.0 5.0 - 8.0   Glucose, UA NEGATIVE NEGATIVE mg/dL   Hgb urine dipstick MODERATE (A) NEGATIVE   Bilirubin Urine NEGATIVE NEGATIVE   Ketones, ur 5 (A) NEGATIVE mg/dL   Protein, ur NEGATIVE NEGATIVE mg/dL   Nitrite NEGATIVE NEGATIVE   Leukocytes, UA NEGATIVE NEGATIVE   RBC / HPF >50 (H) 0 - 5 RBC/hpf   WBC, UA 0-5  0 - 5 WBC/hpf   Bacteria, UA RARE (A) NONE SEEN   Squamous Epithelial / LPF 0-5 0 - 5   Mucus PRESENT   I-Stat beta hCG blood, ED  Result Value Ref Range   I-stat hCG, quantitative <5.0 <5 mIU/mL   Comment 3          I-Stat CG4 Lactic Acid, ED  Result Value Ref Range   Lactic Acid, Venous 1.37 0.5 - 1.9 mmol/L   Dg Chest 2 View  Result Date: 10/31/2017 CLINICAL DATA:  RIGHT lower quadrant pain EXAM: CHEST - 2 VIEW COMPARISON:  None. FINDINGS: The heart size and mediastinal contours are within normal limits. Both lungs are clear. The visualized skeletal structures are unremarkable. IMPRESSION: No acute cardiopulmonary process. Electronically Signed   By: Suzy Bouchard M.D.   On: 10/31/2017 00:05   Dg Abdomen 1 View  Result Date: 10/31/2017 CLINICAL DATA:  RIGHT lower quadrant pain. History of abdominal surgery May 2019. EXAM: ABDOMEN - 1 VIEW COMPARISON:  CT abdomen pelvis Oct 07, 2017 FINDINGS: The bowel gas pattern is  normal. Moderate amount of retained large bowel stool. Surgical clips in the included right abdomen compatible with cholecystectomy. No radio-opaque calculi or other significant radiographic abnormality are seen. IMPRESSION: Moderate amount of retained large bowel stool. Normal bowel gas pattern. Electronically Signed   By: Elon Alas M.D.   On: 10/31/2017 00:08   Ct Abdomen Pelvis W Contrast  Result Date: 10/31/2017 CLINICAL DATA:  Right lower quadrant pain since 1900 hours. Low grade fever. Patient had a liver cyst removed laparoscopically on 09/27/2017. Drain was removed this past Friday. EXAM: CT ABDOMEN AND PELVIS WITH CONTRAST TECHNIQUE: Multidetector CT imaging of the abdomen and pelvis was performed using the standard protocol following bolus administration of intravenous contrast. CONTRAST:  137mL OMNIPAQUE IOHEXOL 300 MG/ML  SOLN COMPARISON:  10/07/2017 FINDINGS: Lower chest: Small right pleural effusion with probable atelectasis in the right lung base. This is likely reactive. Hepatobiliary: There is a small residual subdiaphragmatic gas and fluid collection over the posterior liver and adjacent to surgical clips. This is significantly smaller than on the previous study, today measuring about 2.8 x 8.4 cm. Small cyst in the right lobe of the liver inferiorly measuring about 1.3 cm diameter. Additional scattered subcentimeter cysts. Surgical absence of the gallbladder. No bile duct dilatation. Pancreas: Unremarkable. No pancreatic ductal dilatation or surrounding inflammatory changes. Spleen: Normal in size without focal abnormality. Adrenals/Urinary Tract: No adrenal gland nodules. 4 mm stone in the proximal right ureter at the level of L3. Proximal right hydronephrosis and hydroureter with stranding around the right kidney and delayed right nephrogram. 4 mm intrarenal stone on the left kidney without hydronephrosis. No left ureteral stones. Bladder wall is not thickened. No bladder filling  defects. Stomach/Bowel: Stomach is within normal limits. Appendix appears normal. No evidence of bowel wall thickening, distention, or inflammatory changes. Vascular/Lymphatic: No significant vascular findings are present. No enlarged abdominal or pelvic lymph nodes. Reproductive: Surgical absence of the uterus. No abnormal adnexal masses. Other: No free fluid or free air in the abdomen. Minimal periumbilical hernia containing fat. Musculoskeletal: No acute or significant osseous findings. IMPRESSION: 1. Small residual subdiaphragmatic gas and fluid collection over the posterior liver is significantly smaller than on previous study. 2. Probable reactive small right pleural effusion with atelectasis. 3. 4 mm stone in the proximal right ureter with moderate proximal obstruction. 4. Nonobstructing stone in the left kidney. Electronically Signed   By: Lucienne Capers  M.D.   On: 10/31/2017 02:03     Antonietta Breach, PA-C 10/31/17 0425    Orpah Greek, MD 11/01/17 [phone]

## 2017-10-31 NOTE — Discharge Instructions (Signed)
Continue with 5 to 10 mL of oxycodone every 6 hours for pain.  You have been prescribed Flomax to try and promote stone movement.  Call [facility] in the morning to schedule close follow-up in their office.  Should you experience worsening pain despite use of oxycodone and Flomax, present to [facility] emergency department for additional evaluation.  You may also return for other new or concerning symptoms.

## 2017-10-31 NOTE — H&P (Signed)
Urology Preoperative H&P   Chief Complaint: Right flank pain  History of Present Illness: [name] is a 62 y.o. female seen in the [facility] ED on 10/30/17 for worsening right flank pain that radiates to her right inguinal region and is partially alleviated with Dilaudid. She also reports persistent nausea/vomiting that started immediately after her emergency room encounter. She denies fever/chills or gross hematuria. CTSS from that day showed a 4.5 mm right UPJ stone with mild right hydronpehrosis. Hx of one prior stone episode in 2007 that did not required surgical intervention. UA from the emergency room showed few bacteria, but was negative for nitrite and leukocyte esterase.   KUB from today shows that she has a persistent calcification in the midportion of the right ureter at the level of the L3 right spinous process.    Past Medical History:  Diagnosis Date  . Anemia    iron deficiency anemia  . 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 Dyspnea    due to liver cyst  . GERD (gastroesophageal reflux disease)   . Headache   . History of kidney stones     Past Surgical History:  Procedure Laterality Date  . ABDOMINAL HYSTERECTOMY    . BREAST EXCISIONAL BIOPSY Left   . CHOLECYSTECTOMY    . COLPOSCOPY    . LAPAROSCOPIC LIVER CYST REMOVAL N/A 09/27/2017   Procedure: LAPAROSCOPIC LIVER CYST UNROOFING;  Surgeon: [name], MD;  Location: [place];  Service: General;  Laterality: N/A;  . WISDOM TOOTH EXTRACTION      Allergies: No Known Allergies  Family History  Problem Relation Age of Onset  . Hypertension Mother   . Hypertension Father   . Hypertension Brother   . Breast cancer Paternal Aunt   . Diabetes Maternal Grandmother     Social History:  reports that she has never smoked. She has never used smokeless tobacco. She reports that she does not drink alcohol or use drugs.  ROS: A complete review of systems was performed.  All systems are  negative except for pertinent findings as noted.  Physical Exam:  Vital signs in last 24 hours: Temp:  [98.3 F (36.8 C)-100.6 F (38.1 C)] 98.3 F (36.8 C) (06/17 1557) Pulse Rate:  [67-98] 67 (06/17 1557) Resp:  [13-29] 20 (06/17 1557) BP: (105-131)/(61-82) 105/61 (06/17 1557) SpO2:  [90 %-100 %] 100 % (06/17 1557) Weight:  [72.6 kg (160 lb)] 72.6 kg (160 lb) (06/17 1530) Constitutional:  Alert and oriented, No acute distress Cardiovascular: Regular rate and rhythm, No JVD Respiratory: Normal respiratory effort, Lungs clear bilaterally GI: Abdomen is soft, nontender, nondistended, no abdominal masses GU: No CVA tenderness Lymphatic: No lymphadenopathy Neurologic: Grossly intact, no focal deficits Psychiatric: Normal mood and affect  Laboratory Data:  Recent Labs    10/30/17 2203  WBC 7.6  HGB 9.4*  HCT 31.9*  PLT 279    Recent Labs    10/30/17 2203  NA 136  K 3.8  CL 104  GLUCOSE 143*  BUN 13  CALCIUM 9.2  CREATININE 1.12*     Results for orders placed or performed during the hospital encounter of 10/30/17 (from the past 24 hour(s))  Lipase, blood     Status: None   Collection Time: 10/30/17 10:03 PM  Result Value Ref Range   Lipase 50 11 - 51 U/L  Comprehensive metabolic panel     Status: Abnormal   Collection Time: 10/30/17 10:03 PM  Result Value Ref Range   Sodium 136 135 - 145 mmol/L   Potassium 3.8 3.5 - 5.1 mmol/L   Chloride 104 101 - 111 mmol/L   CO2 22 22 - 32 mmol/L   Glucose, Bld 143 (H) 65 - 99 mg/dL   BUN 13 6 - 20 mg/dL   Creatinine, Ser 1.12 (H) 0.44 - 1.00 mg/dL   Calcium 9.2 8.9 - 10.3 mg/dL   Total Protein 7.3 6.5 - 8.1 g/dL   Albumin 3.6 3.5 - 5.0 g/dL   AST 53 (H) 15 - 41 U/L   ALT 56 (H) 14 - 54 U/L   Alkaline Phosphatase 116 38 - 126 U/L   Total Bilirubin 0.3 0.3 - 1.2 mg/dL   GFR calc non Af Amer 52 (L) >60 mL/min   GFR calc Af Amer >60 >60 mL/min   Anion gap 10 5 - 15  CBC     Status: Abnormal   Collection Time: 10/30/17  10:03 PM  Result Value Ref Range   WBC 7.6 4.0 - 10.5 K/uL   RBC 3.85 (L) 3.87 - 5.11 MIL/uL   Hemoglobin 9.4 (L) 12.0 - 15.0 g/dL   HCT 31.9 (L) 36.0 - 46.0 %   MCV 82.9 78.0 - 100.0 fL   MCH 24.4 (L) 26.0 - 34.0 pg   MCHC 29.5 (L) 30.0 - 36.0 g/dL   RDW 15.6 (H) 11.5 - 15.5 %   Platelets 279 150 - 400 K/uL  I-Stat beta hCG blood, ED     Status: None   Collection Time: 10/30/17 10:12 PM  Result Value Ref Range   I-stat hCG, quantitative <5.0 <5 mIU/mL   Comment 3          I-Stat CG4 Lactic Acid, ED     Status: None   Collection Time: 10/30/17 10:21 PM  Result Value Ref Range   Lactic Acid, Venous 1.37 0.5 - 1.9 mmol/L  Urinalysis, Routine w reflex microscopic     Status: Abnormal   Collection Time: 10/31/17  2:15 AM  Result Value Ref Range   Color, Urine YELLOW YELLOW   APPearance CLEAR CLEAR   Specific Gravity, Urine >1.046 (H) 1.005 - 1.030   pH 8.0 5.0 - 8.0   Glucose, UA NEGATIVE NEGATIVE mg/dL   Hgb urine dipstick MODERATE (A) NEGATIVE   Bilirubin Urine NEGATIVE NEGATIVE   Ketones, ur 5 (A) NEGATIVE mg/dL   Protein, ur NEGATIVE NEGATIVE mg/dL   Nitrite NEGATIVE NEGATIVE   Leukocytes, UA NEGATIVE NEGATIVE   RBC / HPF >50 (H) 0 - 5 RBC/hpf   WBC, UA 0-5 0 - 5 WBC/hpf   Bacteria, UA RARE (A) NONE SEEN   Squamous Epithelial / LPF 0-5 0 - 5   Mucus PRESENT    No results found for this or any previous visit (from the past 240 hour(s)).  Renal Function: Recent Labs    10/30/17 2203  CREATININE 1.12*   Estimated Creatinine Clearance: 51 mL/min (A) (by C-G formula based on SCr of 1.12 mg/dL (H)).  Radiologic Imaging: Dg Chest 2 View  Result Date: 10/31/2017 CLINICAL DATA:  RIGHT lower quadrant pain EXAM: CHEST - 2 VIEW COMPARISON:  None. FINDINGS: The heart size and mediastinal contours are within normal limits. Both lungs are clear. The visualized skeletal structures are unremarkable. IMPRESSION: No acute cardiopulmonary process. Electronically Signed   By: Suzy Bouchard M.D.   On: 10/31/2017 00:05   Dg Abd 1 View  Result Date: 10/31/2017 CLINICAL DATA:  62 year old female with obstructing right ureteral pelvic junction stone. Subsequent encounter. EXAM: ABDOMEN - 1 VIEW COMPARISON:  10/31/2017 CT (1:14 a.m.) and post CT plain film exam (12:39 p.m.). FINDINGS: There remains contrast in the right renal collecting system to level of the L4-5 disc space. It is possible this is secondary to CT detected 4 mm ureteral stone. Stone is not as well delineated on present plain film exam. Decrease in amount of contrast within the urinary bladder. IMPRESSION: There remains contrast in the right renal collecting system to level of the L4-5 disc space. It is possible this is secondary to CT detected 4 mm ureteral stone. Stone is not as well delineated on present plain film exam. Electronically Signed   By: Genia Del M.D.   On: 10/31/2017 15:38   Dg Abdomen 1 View  Result Date: 10/31/2017 CLINICAL DATA:  RIGHT lower quadrant pain. History of abdominal surgery May 2019. EXAM: ABDOMEN - 1 VIEW COMPARISON:  CT abdomen pelvis Oct 07, 2017 FINDINGS: The bowel gas pattern is normal. Moderate amount of retained large bowel stool. Surgical clips in the included right abdomen compatible with cholecystectomy. No radio-opaque calculi or other significant radiographic abnormality are seen. IMPRESSION: Moderate amount of retained large bowel stool. Normal bowel gas pattern. Electronically Signed   By: Elon Alas M.D.   On: 10/31/2017 00:08   Ct Abdomen Pelvis W Contrast  Result Date: 10/31/2017 CLINICAL DATA:  Right lower quadrant pain since 1900 hours. Low grade fever. Patient had a liver cyst removed laparoscopically on 09/27/2017. Drain was removed this past Friday. EXAM: CT ABDOMEN AND PELVIS WITH CONTRAST TECHNIQUE: Multidetector CT imaging of the abdomen and pelvis was performed using the standard protocol following bolus administration of intravenous contrast. CONTRAST:   171mL OMNIPAQUE IOHEXOL 300 MG/ML  SOLN COMPARISON:  10/07/2017 FINDINGS: Lower chest: Small right pleural effusion with probable atelectasis in the right lung base. This is likely reactive. Hepatobiliary: There is a small residual subdiaphragmatic gas and fluid collection over the posterior liver and adjacent to surgical clips. This is significantly smaller than on the previous study, today measuring about 2.8 x 8.4 cm. Small cyst in the right lobe of the liver inferiorly measuring about 1.3 cm diameter. Additional scattered subcentimeter cysts. Surgical absence of the gallbladder. No bile duct dilatation. Pancreas: Unremarkable. No pancreatic ductal dilatation or surrounding inflammatory changes. Spleen: Normal in size without focal abnormality. Adrenals/Urinary Tract: No adrenal gland nodules. 4 mm stone in the proximal right ureter at the level of L3. Proximal right hydronephrosis and hydroureter with stranding around the right kidney and delayed right nephrogram. 4 mm intrarenal stone on the left kidney without hydronephrosis. No left ureteral stones. Bladder wall is not thickened. No bladder filling defects. Stomach/Bowel: Stomach is within normal limits. Appendix appears normal. No evidence of bowel wall thickening, distention, or inflammatory changes. Vascular/Lymphatic: No significant vascular findings are present. No enlarged abdominal or pelvic lymph nodes. Reproductive: Surgical absence of the uterus. No abnormal adnexal masses. Other: No free fluid or free air in the abdomen. Minimal periumbilical hernia containing fat. Musculoskeletal: No acute or significant osseous findings. IMPRESSION: 1. Small residual subdiaphragmatic gas and fluid collection over the posterior liver is significantly smaller than on previous study. 2. Probable reactive small right pleural effusion with atelectasis. 3. 4 mm stone in the proximal right ureter with moderate proximal obstruction. 4. Nonobstructing stone in the left  kidney. Electronically Signed   By: Lucienne Capers M.D.   On: 10/31/2017 02:03  I independently reviewed the above imaging studies.  Assessment and Plan CANDE MASTROPIETRO is a 62 y.o. female with a 4.5 mm right midureteral stone    -The risks, benefits and alternatives of RIGHT ESWL was discussed with the patient. I described the risks which include arrhythmia, kidney contusion, kidney hemorrhage, need for transfusion, back discomfort, flank ecchymosis, flank abrasion, inability to fracture the stone, inability to pass stone fragments, [place], infection associated with obstructing stones, need for different surgical procedure and possible need for repeat shockwave lithotripsy. The patient voices understanding and wishes to proceed.   [name], MD 10/31/2017, [facility] Pager: [phone]

## 2017-10-31 NOTE — Progress Notes (Signed)
Dr. Lovena Neighbours notified of pt. Unable to swallow pills, new orders received.

## 2017-11-01 ENCOUNTER — Encounter (HOSPITAL_COMMUNITY): Payer: Self-pay | Admitting: Urology

## 2017-11-01 LAB — URINE CULTURE

## 2017-12-16 ENCOUNTER — Other Ambulatory Visit: Payer: Self-pay | Admitting: Obstetrics and Gynecology

## 2017-12-16 DIAGNOSIS — Z1231 Encounter for screening mammogram for malignant neoplasm of breast: Secondary | ICD-10-CM

## 2018-02-22 ENCOUNTER — Other Ambulatory Visit: Payer: Self-pay | Admitting: Urology

## 2018-03-02 ENCOUNTER — Ambulatory Visit (HOSPITAL_COMMUNITY)
Admission: RE | Admit: 2018-03-02 | Discharge: 2018-03-02 | Disposition: A | Discharge status: Home / Self Care | Payer: Self-pay | Source: Ambulatory Visit | Attending: Obstetrics and Gynecology | Admitting: Obstetrics and Gynecology

## 2018-03-02 ENCOUNTER — Encounter (HOSPITAL_COMMUNITY): Payer: Self-pay

## 2018-03-02 ENCOUNTER — Ambulatory Visit
Admission: RE | Admit: 2018-03-02 | Discharge: 2018-03-02 | Disposition: A | Discharge status: Home / Self Care | Payer: No Typology Code available for payment source | Source: Ambulatory Visit | Attending: Obstetrics and Gynecology | Admitting: Obstetrics and Gynecology

## 2018-03-02 VITALS — BP 118/72 | Wt 154.0 lb

## 2018-03-02 DIAGNOSIS — Z1231 Encounter for screening mammogram for malignant neoplasm of breast: Secondary | ICD-10-CM

## 2018-03-02 DIAGNOSIS — Z1239 Encounter for other screening for malignant neoplasm of breast: Secondary | ICD-10-CM

## 2018-03-02 NOTE — Patient Instructions (Signed)
Explained breast self awareness with Trey Paula. Patient did not need a Pap smear today due to patient has a history of a hysterectomy for benign reasons. Let patient know that she doesn't need any further Pap smears due to her history of a hysterectomy for benign reasons. Referred patient to the [facility] for a screening mammogram. Appointment scheduled for Thursday, March 02, 2018 at 1540. Patient aware of appointment and will be there. Let [facility] know the [facility] will follow up with her within the next couple weeks with results of mammogram by letter or phone. Trey Paula verbalized understanding.  Dymond Gutt, Arvil Chaco, RN 2:55 PM

## 2018-03-02 NOTE — Progress Notes (Signed)
No complaints today.   Pap Smear: Pap smear not completed today. Last Pap smear was 04/18/2012 at Dr. Josie Dixon office and normal. Per patient has a history of an abnormal Pap smear in the early 1990's that a colposcopy was completed for follow up. Patient has a history of a hysterectomy in 1998 for ovarian cysts. Patient no longer needs Pap smears due to her history of a hysterectomy for benign reasons per BCCCP and ACOG guidelines. Last Pap smear result is in EPIC.  Physical exam: Breasts Breasts symmetrical. No skin abnormalities bilateral breasts. No nipple retraction bilateral breasts. No nipple discharge bilateral breasts. No lymphadenopathy. No lumps palpated bilateral breasts. No complaints of pain or tenderness on exam. Referred patient to the [facility] for a screening mammogram. Appointment scheduled for Thursday, March 02, 2018 at 1540.        Pelvic/Bimanual No Pap smear completed today since patient has a history of a hysterectomy for benign reasons. Pap smear not indicated per BCCCP guidelines.   Smoking History: Patient has never smoked.  Patient Navigation: Patient education provided. Access to services provided for patient through [facility]: Per patient had a colonoscopy completed in 2016. FIT test completed 07/19/2016 that was negative. No complaints today. FIT Test given to patient to complete and return to BCCCP.  Breast and Cervical Cancer Risk Assessment: Patient has a family history of a paternal aunt having breast cancer. Patient has no known genetic mutations or history of radiation treatment to the chest before age 30. Per patient has a history of cervical dysplasia. Patient has no history of being immunocompromised or DES exposure in-utero.  Risk Assessment    Risk Scores      03/02/2018   Last edited by: Armond Hang, LPN   5-year risk: 1.1 %   Lifetime risk: 5.3 %

## 2018-03-07 ENCOUNTER — Other Ambulatory Visit: Payer: Self-pay | Admitting: Urology

## 2018-03-07 ENCOUNTER — Other Ambulatory Visit: Payer: Self-pay

## 2018-03-13 ENCOUNTER — Ambulatory Visit (HOSPITAL_COMMUNITY)
Admission: RE | Admit: 2018-03-13 | Discharge: 2018-03-13 | Disposition: A | Discharge status: Home / Self Care | Payer: Self-pay | Source: Ambulatory Visit | Attending: Urology | Admitting: Urology

## 2018-03-13 ENCOUNTER — Encounter (HOSPITAL_COMMUNITY): Payer: Self-pay | Admitting: General Practice

## 2018-03-13 ENCOUNTER — Ambulatory Visit (HOSPITAL_COMMUNITY): Payer: Self-pay

## 2018-03-13 ENCOUNTER — Encounter (HOSPITAL_COMMUNITY)
Admission: RE | Disposition: A | Discharge status: Home / Self Care | Payer: Self-pay | Source: Ambulatory Visit | Attending: Urology

## 2018-03-13 DIAGNOSIS — I499 Cardiac arrhythmia, unspecified: Secondary | ICD-10-CM | POA: Insufficient documentation

## 2018-03-13 DIAGNOSIS — N200 Calculus of kidney: Secondary | ICD-10-CM | POA: Insufficient documentation

## 2018-03-13 DIAGNOSIS — M940 Chondrocostal junction syndrome [Tietze]: Secondary | ICD-10-CM | POA: Insufficient documentation

## 2018-03-13 DIAGNOSIS — D509 Iron deficiency anemia, unspecified: Secondary | ICD-10-CM | POA: Insufficient documentation

## 2018-03-13 DIAGNOSIS — Z9049 Acquired absence of other specified parts of digestive tract: Secondary | ICD-10-CM | POA: Insufficient documentation

## 2018-03-13 DIAGNOSIS — K7689 Other specified diseases of liver: Secondary | ICD-10-CM | POA: Insufficient documentation

## 2018-03-13 DIAGNOSIS — Z8249 Family history of ischemic heart disease and other diseases of the circulatory system: Secondary | ICD-10-CM | POA: Insufficient documentation

## 2018-03-13 DIAGNOSIS — R0600 Dyspnea, unspecified: Secondary | ICD-10-CM | POA: Insufficient documentation

## 2018-03-13 DIAGNOSIS — Z803 Family history of malignant neoplasm of breast: Secondary | ICD-10-CM | POA: Insufficient documentation

## 2018-03-13 DIAGNOSIS — K219 Gastro-esophageal reflux disease without esophagitis: Secondary | ICD-10-CM | POA: Insufficient documentation

## 2018-03-13 DIAGNOSIS — Z90710 Acquired absence of both cervix and uterus: Secondary | ICD-10-CM | POA: Insufficient documentation

## 2018-03-13 DIAGNOSIS — Z833 Family history of diabetes mellitus: Secondary | ICD-10-CM | POA: Insufficient documentation

## 2018-03-13 DIAGNOSIS — R51 Headache: Secondary | ICD-10-CM | POA: Insufficient documentation

## 2018-03-13 DIAGNOSIS — Z87442 Personal history of urinary calculi: Secondary | ICD-10-CM | POA: Insufficient documentation

## 2018-03-13 HISTORY — PX: EXTRACORPOREAL SHOCK WAVE LITHOTRIPSY: SHX1557

## 2018-03-13 SURGERY — LITHOTRIPSY, ESWL
Anesthesia: LOCAL | Laterality: Left

## 2018-03-13 MED ORDER — CIPROFLOXACIN HCL 500 MG PO TABS: 500.0000 mg | ORAL_TABLET | ORAL | Status: DC

## 2018-03-13 MED ORDER — CIPROFLOXACIN IN D5W 400 MG/200ML IV SOLN
400.0000 mg | Freq: Once | INTRAVENOUS | Status: AC
  Administered 2018-03-13: 400 mg via INTRAVENOUS
  Filled 2018-03-13: qty 200

## 2018-03-13 MED ORDER — SODIUM CHLORIDE 0.9 % IV SOLN
INTRAVENOUS | Status: DC
  Administered 2018-03-13: 08:00:00 via INTRAVENOUS

## 2018-03-13 MED ORDER — DIPHENHYDRAMINE HCL 25 MG PO CAPS: 25.0000 mg | ORAL_CAPSULE | ORAL | Status: DC

## 2018-03-13 MED ORDER — DIAZEPAM 5 MG PO TABS: 10.0000 mg | ORAL_TABLET | ORAL | Status: DC

## 2018-03-13 MED ORDER — LORAZEPAM 2 MG/ML IJ SOLN
0.5000 mg | Freq: Once | INTRAMUSCULAR | Status: AC
  Administered 2018-03-13: 0.5 mg via INTRAVENOUS
  Filled 2018-03-13: qty 0.25

## 2018-03-13 MED ORDER — DIPHENHYDRAMINE HCL 50 MG/ML IJ SOLN
25.0000 mg | Freq: Once | INTRAMUSCULAR | Status: AC
  Administered 2018-03-13: 25 mg via INTRAVENOUS
  Filled 2018-03-13: qty 1

## 2018-03-13 NOTE — Op Note (Signed)
[facility] Operative Note  Treating Physician: [name], MD  Pre-op diagnosis: Left renal stone  Post-op diagnosis: Same   Procedure: LEFT ESWL  See Aris Everts OP note scanned into chart. Also because of the size, density, location and other factors that cannot be anticipated I feel this will likely be a staged procedure. This fact supersedes any indication in the scanned [place] stone operative note to the contrary

## 2018-03-13 NOTE — H&P (Signed)
Urology Preoperative H&P   Chief Complaint:   History of Present Illness: [name] is a 62 y.o. female with a history of kidney stones s/p right ESWL on 10/31/17 for a 4.5 mm right UPJ stone. She also has two non-obstructing left renal stones measuring 5 and 7 mm.  [name] is here today for treatment of her left renal stones. She denies interval stone passage or flank pain. From a urinary standpoint, she reports a good FOS and feels like she is emptying her bladder well. She has occasional urgency/frequency, but is not bothered by it. Nocturia x 1. Denies interval UTIs, dysuria or hematuria.     Past Medical History:  Diagnosis Date  . Anemia    iron deficiency anemia  . 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 Dyspnea    due to liver cyst  . GERD (gastroesophageal reflux disease)   . Headache   . History of kidney stones     Past Surgical History:  Procedure Laterality Date  . ABDOMINAL HYSTERECTOMY    . BREAST EXCISIONAL BIOPSY Left   . CHOLECYSTECTOMY    . COLPOSCOPY    . EXTRACORPOREAL SHOCK WAVE LITHOTRIPSY Right 10/31/2017   Procedure: RIGHT EXTRACORPOREAL SHOCK WAVE LITHOTRIPSY (ESWL);  Surgeon: Ceasar Mons, MD;  Location: [facility];  Service: Urology;  Laterality: Right;  . LAPAROSCOPIC LIVER CYST REMOVAL N/A 09/27/2017   Procedure: LAPAROSCOPIC LIVER CYST UNROOFING;  Surgeon: [name], MD;  Location: [facility];  Service: General;  Laterality: N/A;  . WISDOM TOOTH EXTRACTION      Allergies: No Known Allergies  Family History  Problem Relation Age of Onset  . Hypertension Mother   . Hypertension Father   . Hypertension Brother   . Breast cancer Paternal Aunt   . Diabetes Maternal Grandmother     Social History:  reports that she has never smoked. She has never used smokeless tobacco. She reports that she does not drink alcohol or use drugs.  ROS: A complete review of systems was performed.  All systems are negative  except for pertinent findings as noted.  Physical Exam:  Vital signs in last 24 hours:   Constitutional:  Alert and oriented, No acute distress Cardiovascular: Regular rate and rhythm, No JVD Respiratory: Normal respiratory effort, Lungs clear bilaterally GI: Abdomen is soft, nontender, nondistended, no abdominal masses GU: No CVA tenderness Lymphatic: No lymphadenopathy Neurologic: Grossly intact, no focal deficits Psychiatric: Normal mood and affect  Laboratory Data:  No results for input(s): WBC, HGB, HCT, PLT in the last 72 hours.  No results for input(s): NA, K, CL, GLUCOSE, BUN, CALCIUM, CREATININE in the last 72 hours.  Invalid input(s): CO3   No results found for this or any previous visit (from the past 24 hour(s)). No results found for this or any previous visit (from the past 240 hour(s)).  Renal Function: No results for input(s): CREATININE in the last 168 hours. CrCl cannot be calculated (Patient's most recent lab result is older than the maximum 21 days allowed.).  Radiologic Imaging: No results found.  I independently reviewed the above imaging studies.  Assessment and Plan [name] is a 62 y.o. female with multiple left renal stones  The risks, benefits and alternatives of LEFT ESWL was discussed with the patient. I described the risks which include arrhythmia, kidney contusion, kidney hemorrhage, need for transfusion, back discomfort, flank ecchymosis, flank abrasion, inability to fracture the stone, inability to pass  stone fragments, Steinstrasse, infection associated with obstructing stones, need for an alternative surgical procedure and possible need for repeat shockwave lithotripsy.  The patient voices understanding and wishes to proceed.    [name], MD 03/13/2018, 6:47 AM  [facility] Pager: [phone]

## 2018-03-14 ENCOUNTER — Encounter (HOSPITAL_COMMUNITY): Payer: Self-pay | Admitting: Urology

## 2018-03-16 ENCOUNTER — Encounter (HOSPITAL_COMMUNITY): Payer: Self-pay | Admitting: *Deleted

## 2018-03-21 ENCOUNTER — Other Ambulatory Visit (HOSPITAL_COMMUNITY): Payer: Self-pay | Admitting: *Deleted

## 2018-03-21 DIAGNOSIS — Z0000 Encounter for general adult medical examination without abnormal findings: Secondary | ICD-10-CM

## 2018-03-24 ENCOUNTER — Inpatient Hospital Stay: Payer: Self-pay

## 2018-03-24 ENCOUNTER — Inpatient Hospital Stay: Payer: Self-pay | Attending: Obstetrics and Gynecology | Admitting: *Deleted

## 2018-03-24 VITALS — BP 118/70 | Ht 63.0 in | Wt 151.0 lb

## 2018-03-24 DIAGNOSIS — Z0000 Encounter for general adult medical examination without abnormal findings: Secondary | ICD-10-CM

## 2018-03-24 LAB — LIPID PANEL
Cholesterol: 226 mg/dL — ABNORMAL HIGH (ref 0–200)
HDL: 79 mg/dL (ref >40)
LDL Cholesterol: 134 mg/dL — ABNORMAL HIGH (ref 0–99)
Total CHOL/HDL Ratio: 2.9 RATIO
Triglycerides: 63 mg/dL (ref <150)
VLDL: 13 mg/dL (ref 0–40)

## 2018-03-24 LAB — HEMOGLOBIN A1C
HEMOGLOBIN A1C: 5.8 % — AB (ref 4.8–5.6)
Mean Plasma Glucose: 119.76 mg/dL

## 2018-03-24 NOTE — Addendum Note (Signed)
Addended by: Oliver Hum D on: 03/24/2018 01:58 PM   Modules accepted: Orders

## 2018-03-24 NOTE — Progress Notes (Signed)
Wisewoman initial screening  Clinical Measurement:  Height: 63in  Weight: 151lb  Blood Pressure:  122/78 Blood Pressure #2: 118/70  Fasting Labs Drawn Today, will review with patient when they result.  Medical History:  Patient states that she has not been diagnosed with high cholesterol, high blood pressure, diabetes or heart disease.  Medications:  Patients states she is not taking any medications for high cholesterol, high blood pressure or diabetes.  She is not taking aspirin daily to prevent heart attack or stroke.    Blood pressure, self measurement:  Patients states she does measure blood pressure at home,monthly.  Patient states she does not share the readings with a provider.  Nutrition:  Patient states she eats 0 cups of fruit and 1 cup of vegetables in an average day.  Patient states she does not eat fish regularly, she eats less than half a serving of whole grains daily. She drinks less than 36 ounces of beverages with added sugar weekly.  She is currently watching her sodium intake.  She has not had any drinks containing alcohol in the last seven days.    Physical activity:  Patient states that she gets 0 minutes of moderate exercise in a week.  She gets 0 minutes of vigorous exercise per week.    Smoking status:  Patient states she has never smoked and is not around any smokers.    Quality of life:  Patient states that she has had 0 bad physical days out of the last 30 days. In the last 2 weeks, she has had 0 days that she has felt down or depressed. She has had 0 days in the last 2 weeks that she has had little interest or pleasure in doing things.  Risk reduction and counseling:  Patient states she wants to  increase fruit and vegetable intake.  I encouraged her to start an  exercise regimen and increase vegetable and fruit intake.  Navigation:  I will notify patient of lab results.  Patient is aware of 2 more health coaching sessions and a follow up.

## 2018-03-27 ENCOUNTER — Telehealth (HOSPITAL_COMMUNITY): Payer: Self-pay | Admitting: *Deleted

## 2018-03-27 NOTE — Telephone Encounter (Signed)
Health coaching 2   Labs- cholesterol 226, LDL cholesterol 134, triglycerides 63, HDL cholesterol 79, hemoglobin A1C 5.8, mean plasma glucose 119.76   Patient is aware and understanding these lab results.  Goals-Patient states she wants to eat more fruits and vegetables.  I encouraged patient to eat at least 5 fruits and vegetables by drinking smoothies.  Patient states that she wants to exercise.  I encouraged patient to walk at least 150 minutes/weekly.  Patient states that she is busy taking care of her grandmother and has little time for anything.  I encouraged patient that she must take care of herself both mentally and physically by doing small things like walking 30 minutes daily.  Navigation:  Patient is aware of 1 more health coaching sessions and a follow up.  Time- 10 minutes

## 2018-04-02 LAB — FECAL OCCULT BLOOD, IMMUNOCHEMICAL: FECAL OCCULT BLD: POSITIVE — AB

## 2018-04-07 ENCOUNTER — Telehealth (HOSPITAL_COMMUNITY): Payer: Self-pay | Admitting: *Deleted

## 2018-04-07 NOTE — Telephone Encounter (Signed)
I made a referral for patient regarding a positive stool card.  Patient has an appointment for Monday, November 25th at 11:30am with Dr. Lajean Manes, [facility].

## 2018-04-07 NOTE — Telephone Encounter (Signed)
I notified patient regarding her results for her stool test.  Patient is aware and understands these results.

## 2018-04-18 ENCOUNTER — Telehealth: Payer: Self-pay

## 2018-04-18 NOTE — Telephone Encounter (Signed)
Call is regarding [name]'s 3rd health coaching session with [place].

## 2018-04-18 NOTE — Telephone Encounter (Signed)
Health Coaching 3  Current:  Patient states that she is currently eating 0 servings of fruit and 0 servings of vegetables each day.  Patient states that she does like raisins, bananas, and apples without the peeling.  Patient states that she does like broccoli, collard greens, green peas, iceberg lettuce, and corn on the cob.  Patient states that she is currently eating 0 servings of whole grains/day.  Patient states that she does like brown rice and whole grain bread.  Patient states that she is eating fish ([place]bac[place] tuna, salmon, flounder).  Patient states that she drinks one sugar drink per day (coffee with sugar).  Patient states that she drinks 8 glasses of water per day.  Patient states that she very rarely eats fried foods but usually when she does it's chicken tenders with french fries.  Patient states that she dances for physical activity one time per week.  New Goal:    Goal #1: Patient states her goal is to research heart healthy/low sodium recipes for preparing food that she will like by June 19, 2018.  Barrier(s) to reaching goal:  Patient states time and money are barriers to her reaching her goal.  Strategies to overcome barrier(s):  Patient states getting a job and planning time in the afternoons to work on her goal are strategies for her to overcome the barriers to her achieving her goal.   Confidence Level for achieving goal (1-10):  Patient states that her confidence level for achieving her goal is 5.  Navigation:  Patient is aware of a follow-up session, said afternoons are a good time for [place] to contact her.  Time:  40 minutes

## 2018-04-28 ENCOUNTER — Other Ambulatory Visit: Payer: Self-pay | Admitting: Gastroenterology

## 2018-06-02 ENCOUNTER — Inpatient Hospital Stay: Payer: Self-pay | Attending: Obstetrics and Gynecology

## 2018-06-02 NOTE — Progress Notes (Unsigned)
Wisewoman follow up  Clinical Measurement:  Height: 63in Weight: 146lb  Blood Pressure: 120/72 Blood Pressure #2: 118/70 Fasting Labs Drawn Today, will review with patient when they result.  Medical History:  Patient states that she has not been diagnosed with high cholesterol, high blood pressure, diabetes or heart disease.  Medications:  Patients states she is not taking any medications for high cholesterol, high blood pressure or diabetes.  She is not taking aspirin daily to prevent heart attack or stroke.    Blood pressure, self measurement:  Patients states she does measure blood pressure at home.    Nutrition:  Patient states she eats 0.5 cup of fruit and 0.5 cup of vegetables in an average day.  Patient states she does  eat fish regularly, she eats less  than half a serving of whole grains daily. She drinks less than 36 ounces of beverages with added sugar weekly.  She is currently watching her sodium intake.  She has not had any drinks containing alcohol in the last seven days.    Physical activity:  Patient states that she gets 0 minutes of moderate exercise in a week.  She gets 0 minutes of vigorous exercise per week.    Smoking status:  Patient states she has never smoked and is not around any smokers.    Quality of life:  Patient states that she has had 0 bad physical days out of the last 30 days. In the last 2 weeks, she has had 0 days that she has felt down or depressed. She has had  A few  days in the last 2 weeks that she has had little interest or pleasure in doing things.  Navigation: This was the  follow up session for this patient, I will check up on her progress in the coming months.

## 2018-06-09 NOTE — Anesthesia Preprocedure Evaluation (Addendum)
Anesthesia Evaluation  Patient identified by MRN, date of birth, ID band Patient awake    Reviewed: Allergy & Precautions, NPO status , Patient's Chart, lab work & pertinent test results  History of Anesthesia Complications Negative for: history of anesthetic complications  Airway Mallampati: II  TM Distance: >3 FB Neck ROM: Full    Dental  (+) Teeth Intact, Dental Advisory Given   Pulmonary neg pulmonary ROS,    Pulmonary exam normal breath sounds clear to auscultation       Cardiovascular negative cardio ROS Normal cardiovascular exam Rhythm:Regular Rate:Normal     Neuro/Psych negative neurological ROS     GI/Hepatic Neg liver ROS, GERD  Medicated,  Endo/Other  negative endocrine ROS  Renal/GU negative Renal ROS     Musculoskeletal negative musculoskeletal ROS (+)   Abdominal   Peds  Hematology  (+) anemia ,   Anesthesia Other Findings Day of surgery medications reviewed with the patient.  Reproductive/Obstetrics                            Anesthesia Physical Anesthesia Plan  ASA: II  Anesthesia Plan: MAC   Post-op Pain Management:    Induction:   PONV Risk Score and Plan: 2 and Treatment may vary due to age or medical condition and Propofol infusion  Airway Management Planned: Natural Airway and Nasal Cannula  Additional Equipment:   Intra-op Plan:   Post-operative Plan:   Informed Consent: I have reviewed the patients History and Physical, chart, labs and discussed the procedure including the risks, benefits and alternatives for the proposed anesthesia with the patient or authorized representative who has indicated his/her understanding and acceptance.     Dental advisory given  Plan Discussed with: CRNA  Anesthesia Plan Comments:        Anesthesia Quick Evaluation

## 2018-06-12 ENCOUNTER — Ambulatory Visit (HOSPITAL_COMMUNITY)
Admission: RE | Admit: 2018-06-12 | Discharge: 2018-06-12 | Disposition: A | Discharge status: Home / Self Care | Payer: Self-pay | Attending: Gastroenterology | Admitting: Gastroenterology

## 2018-06-12 ENCOUNTER — Ambulatory Visit (HOSPITAL_COMMUNITY): Payer: Self-pay | Admitting: Anesthesiology

## 2018-06-12 ENCOUNTER — Encounter (HOSPITAL_COMMUNITY)
Admission: RE | Disposition: A | Discharge status: Home / Self Care | Payer: Self-pay | Source: Home / Self Care | Attending: Gastroenterology

## 2018-06-12 ENCOUNTER — Encounter (HOSPITAL_COMMUNITY): Payer: Self-pay | Admitting: Gastroenterology

## 2018-06-12 DIAGNOSIS — K640 First degree hemorrhoids: Secondary | ICD-10-CM | POA: Insufficient documentation

## 2018-06-12 DIAGNOSIS — Z79899 Other long term (current) drug therapy: Secondary | ICD-10-CM | POA: Insufficient documentation

## 2018-06-12 DIAGNOSIS — D509 Iron deficiency anemia, unspecified: Secondary | ICD-10-CM | POA: Insufficient documentation

## 2018-06-12 DIAGNOSIS — Z79890 Hormone replacement therapy (postmenopausal): Secondary | ICD-10-CM | POA: Insufficient documentation

## 2018-06-12 DIAGNOSIS — D120 Benign neoplasm of cecum: Secondary | ICD-10-CM | POA: Insufficient documentation

## 2018-06-12 DIAGNOSIS — R195 Other fecal abnormalities: Secondary | ICD-10-CM | POA: Insufficient documentation

## 2018-06-12 DIAGNOSIS — K449 Diaphragmatic hernia without obstruction or gangrene: Secondary | ICD-10-CM | POA: Insufficient documentation

## 2018-06-12 DIAGNOSIS — Z8371 Family history of colonic polyps: Secondary | ICD-10-CM | POA: Insufficient documentation

## 2018-06-12 DIAGNOSIS — K219 Gastro-esophageal reflux disease without esophagitis: Secondary | ICD-10-CM | POA: Insufficient documentation

## 2018-06-12 HISTORY — PX: ESOPHAGOGASTRODUODENOSCOPY (EGD) WITH PROPOFOL: SHX5813

## 2018-06-12 HISTORY — PX: COLONOSCOPY WITH PROPOFOL: SHX5780

## 2018-06-12 HISTORY — PX: POLYPECTOMY: SHX5525

## 2018-06-12 SURGERY — COLONOSCOPY WITH PROPOFOL
Anesthesia: Monitor Anesthesia Care

## 2018-06-12 MED ORDER — LACTATED RINGERS IV SOLN
INTRAVENOUS | Status: DC
  Administered 2018-06-12: 09:00:00 via INTRAVENOUS

## 2018-06-12 MED ORDER — LIDOCAINE 2% (20 MG/ML) 5 ML SYRINGE
INTRAMUSCULAR | Status: DC | PRN
  Administered 2018-06-12: 50 mg via INTRAVENOUS

## 2018-06-12 MED ORDER — PROPOFOL 500 MG/50ML IV EMUL
INTRAVENOUS | Status: DC | PRN
  Administered 2018-06-12: 100 ug/kg/min via INTRAVENOUS

## 2018-06-12 MED ORDER — PROPOFOL 10 MG/ML IV BOLUS
INTRAVENOUS | Status: AC
  Filled 2018-06-12: qty 20

## 2018-06-12 MED ORDER — PROPOFOL 10 MG/ML IV BOLUS
INTRAVENOUS | Status: DC | PRN
  Administered 2018-06-12: 20 mg via INTRAVENOUS
  Administered 2018-06-12: 40 mg via INTRAVENOUS
  Administered 2018-06-12 (×4): 20 mg via INTRAVENOUS

## 2018-06-12 MED ORDER — SODIUM CHLORIDE 0.9 % IV SOLN: INTRAVENOUS | Status: DC

## 2018-06-12 MED ORDER — PROPOFOL 10 MG/ML IV BOLUS
INTRAVENOUS | Status: AC
  Filled 2018-06-12: qty 40

## 2018-06-12 SURGICAL SUPPLY — 25 items

## 2018-06-12 NOTE — Discharge Instructions (Signed)
YOU HAD AN ENDOSCOPIC PROCEDURE TODAY: Refer to the procedure report and other information in the discharge instructions given to you for any specific questions about what was found during the examination. If this information does not answer your questions, please call [facility] office at [phone] to clarify.   YOU SHOULD EXPECT: Some feelings of bloating in the abdomen. Passage of more gas than usual. Walking can help get rid of the air that was put into your GI tract during the procedure and reduce the bloating. If you had a lower endoscopy (such as a colonoscopy or flexible sigmoidoscopy) you may notice spotting of blood in your stool or on the toilet paper. Some abdominal soreness may be present for a day or two, also.  DIET: Your first meal following the procedure should be a light meal and then it is ok to progress to your normal diet. A half-sandwich or bowl of soup is an example of a good first meal. Heavy or fried foods are harder to digest and may make you feel nauseous or bloated. Drink plenty of fluids but you should avoid alcoholic beverages for 24 hours. If you had a esophageal dilation, please see attached instructions for diet.   ACTIVITY: Your care partner should take you home directly after the procedure. You should plan to take it easy, moving slowly for the rest of the day. You can resume normal activity the day after the procedure however YOU SHOULD NOT DRIVE, use power tools, machinery or perform tasks that involve climbing or major physical exertion for 24 hours (because of the sedation medicines used during the test).   SYMPTOMS TO REPORT IMMEDIATELY: A gastroenterologist can be reached at any hour. Please call [phone]  for any of the following symptoms:   Following lower endoscopy (colonoscopy, flexible sigmoidoscopy) Excessive amounts of blood in the stool  Significant tenderness, worsening of abdominal pains  Swelling of the abdomen that is new, acute  Fever of 100  or higher   Following upper endoscopy (EGD, EUS, ERCP, esophageal dilation) Vomiting of blood or coffee ground material  New, significant abdominal pain  New, significant chest pain or pain under the shoulder blades  Painful or persistently difficult swallowing  New shortness of breath  Black, tarry-looking or red, bloody stools  FOLLOW UP:  If any biopsies were taken you will be contacted by phone or by letter within the next 1-3 weeks. Call [phone]  if you have not heard about the biopsies in 3 weeks.  Please also call with any specific questions about appointments or follow up tests.  No aspirin, ibuprofen or other NSAID medications for 5 days. Office will send note or call when pathology results are obtained. Colonoscopy will be repeated based on the pathology results.

## 2018-06-12 NOTE — Op Note (Signed)
[facility] Patient Name: [name] Procedure Date: 06/12/2018 MRN: [id number] Attending MD: Nancy Fetter Dr., MD Date of Birth: 1955/06/10 CSN: [id number] Age: 63 Admit Type: Outpatient Procedure:                Colonoscopy Indications:              Heme positive stool, Gastrointestinal occult blood                            loss Providers:                Jeneen Rinks L. Timber Lucarelli Dr., MD, Cleda Daub, RN,                            William Dalton, Technician Referring MD:              Medicines:                Monitored Anesthesia Care Complications:            No immediate complications. Estimated Blood Loss:     Estimated blood loss: none. Procedure:                Pre-Anesthesia Assessment:                           - Prior to the procedure, a History and Physical                            was performed, and patient medications and                            allergies were reviewed. The patient's tolerance of                            previous anesthesia was also reviewed. The risks                            and benefits of the procedure and the sedation                            options and risks were discussed with the patient.                            All questions were answered, and informed consent                            was obtained. Prior Anticoagulants: The patient has                            taken no previous anticoagulant or antiplatelet                            agents. ASA Grade Assessment: II - A patient with                            mild systemic  disease. After reviewing the risks                            and benefits, the patient was deemed in                            satisfactory condition to undergo the procedure.                           - Prior to the procedure, a History and Physical                            was performed, and patient medications and                            allergies were reviewed. The patient's  tolerance of                            previous anesthesia was also reviewed. The risks                            and benefits of the procedure and the sedation                            options and risks were discussed with the patient.                            All questions were answered, and informed consent                            was obtained. Prior Anticoagulants: The patient has                            taken no previous anticoagulant or antiplatelet                            agents. ASA Grade Assessment: II - A patient with                            mild systemic disease. After reviewing the risks                            and benefits, the patient was deemed in                            satisfactory condition to undergo the procedure.                           After obtaining informed consent, the colonoscope                            was passed under direct vision. Throughout the  procedure, the patient's blood pressure, pulse, and                            oxygen saturations were monitored continuously. The                            [id number]-H190DL ([id number]) Olympus pediatric colonscope                            was introduced through the anus and advanced to the                            the cecum, identified by appendiceal orifice and                            ileocecal valve. The ileocecal valve, appendiceal                            orifice, and rectum were photographed. Scope In: 10:13:56 AM Scope Out: 10:33:25 AM Scope Withdrawal Time: 0 hours 11 minutes 55 seconds  Total Procedure Duration: 0 hours 19 minutes 29 seconds  Findings:      The perianal and digital rectal examinations were normal.      A 7 mm polyp was found in the cecum. The polyp was sessile. The polyp       was removed with a hot snare. Resection and retrieval were complete.      Non-bleeding internal hemorrhoids were found during retroflexion. The        hemorrhoids were small and Grade I (internal hemorrhoids that do not       prolapse). Impression:               - One 7 mm polyp in the cecum, removed with a hot                            snare. Resected and retrieved.                           - Non-bleeding internal hemorrhoids.                           - Blood found in stool Moderate Sedation:      MAC by anesthesia Recommendation:           - Patient has a contact number available for                            emergencies. The signs and symptoms of potential                            delayed complications were discussed with the                            patient. Return to normal activities tomorrow.                            Written  discharge instructions were provided to the                            patient.                           - Resume previous diet.                           - Continue present medications.                           - No aspirin, ibuprofen, naproxen, or other                            non-steroidal anti-inflammatory drugs for 5 days                            after polyp removal.                           - Repeat colonoscopy for surveillance based on                            pathology results. Procedure Code(s):        --- Professional ---                           [phone], Colonoscopy, flexible; with removal of                            tumor(s), polyp(s), or other lesion(s) by snare                            technique Diagnosis Code(s):        --- Professional ---                           D12.0, Benign neoplasm of cecum                           K64.0, First degree hemorrhoids                           R19.5, Other fecal abnormalities CPT copyright 2018 [employer]. All rights reserved. The codes documented in this report are preliminary and upon coder review may  be revised to meet current compliance requirements. Nancy Fetter Dr., MD 06/12/2018 10:46:03 AM This report  has been signed electronically. Number of Addenda: 0

## 2018-06-12 NOTE — Transfer of Care (Signed)
Immediate Anesthesia Transfer of Care Note  Patient: [name]  Procedure(s) Performed: Procedure(s): COLONOSCOPY WITH PROPOFOL (N/A) ESOPHAGOGASTRODUODENOSCOPY (EGD) WITH PROPOFOL (N/A) POLYPECTOMY  Patient Location: PACU  Anesthesia Type:MAC  Level of Consciousness:  sedated, patient cooperative and responds to stimulation  Airway & Oxygen Therapy:Patient Spontanous Breathing and Patient connected to face mask oxgen  Post-op Assessment:  Report given to PACU RN and Post -op Vital signs reviewed and stable  Post vital signs:  Reviewed and stable  Last Vitals:  Vitals:   06/12/18 0855  BP: 136/74  Pulse: 71  Resp: 14  Temp: 36.8 C  SpO2: 374%    Complications: No apparent anesthesia complications

## 2018-06-12 NOTE — Op Note (Signed)
[facility] Patient Name: [name] Procedure Date: 06/12/2018 MRN: [id number] Attending MD: Nancy Fetter Dr., MD Date of Birth: 11/01/1955 CSN: [id number] Age: 63 Admit Type: Outpatient Procedure:                Upper GI endoscopy Indications:              Heme positive stool, Occult blood in stool Providers:                Jeneen Rinks L. Alazne Quant Dr., MD, Cleda Daub, RN,                            William Dalton, Technician Referring MD:              Medicines:                Monitored Anesthesia Care Complications:            No immediate complications. Estimated Blood Loss:     Estimated blood loss: none. Procedure:                Pre-Anesthesia Assessment:                           - Prior to the procedure, a History and Physical                            was performed, and patient medications and                            allergies were reviewed. The patient's tolerance of                            previous anesthesia was also reviewed. The risks                            and benefits of the procedure and the sedation                            options and risks were discussed with the patient.                            All questions were answered, and informed consent                            was obtained. Prior Anticoagulants: The patient has                            taken no previous anticoagulant or antiplatelet                            agents. ASA Grade Assessment: II - A patient with                            mild systemic disease. After reviewing the risks  and benefits, the patient was deemed in                            satisfactory condition to undergo the procedure.                           After obtaining informed consent, the endoscope was                            passed under direct vision. Throughout the                            procedure, the patient's blood pressure, pulse, and           oxygen saturations were monitored continuously. The                            GIF-H190 ([id number]) Olympus gastroscope was                            introduced through the mouth, and advanced to the                            second part of duodenum. The upper GI endoscopy was                            accomplished without difficulty. The patient                            tolerated the procedure well. Scope In: Scope Out: Findings:      A medium-sized hiatal hernia was present.      There is no endoscopic evidence of Barrett's esophagus, esophagitis,       hiatal hernia, inflammation, stricture or varices in the entire       esophagus.      There is no endoscopic evidence of bleeding or ulceration in the entire       examined stomach.      The examined duodenum was normal. Impression:               - Medium-sized hiatal hernia.                           - Normal examined duodenum.                           - No specimens collected.                           - Blood found in stool Moderate Sedation:      Not Applicable - Patient had care per Anesthesia. Recommendation:           - See the other procedure note for documentation of                            additional recommendations. Procedure Code(s):        --- Professional ---  98921, Esophagogastroduodenoscopy, flexible,                            transoral; diagnostic, including collection of                            specimen(s) by brushing or washing, when performed                            (separate procedure) Diagnosis Code(s):        --- Professional ---                           K44.9, Diaphragmatic hernia without obstruction or                            gangrene                           R19.5, Other fecal abnormalities CPT copyright 2018 American Medical Association. All rights reserved. The codes documented in this report are preliminary and upon coder review may  be revised to  meet current compliance requirements. Nancy Fetter Dr., MD 06/12/2018 10:42:09 AM This report has been signed electronically. Number of Addenda: 0

## 2018-06-12 NOTE — H&P (Signed)
Subjective:   Patient is a 63 y.o. female presents with Recent Hemoccult positive stools during physical exam.  She has had prior colonoscopies last in 2016 due to family history of colon polyps.  She has been chronically iron deficient has been on iron replacement.  For this reason EGD and colonoscopy are to be performed. Procedure including risks and benefits discussed in office.  Patient Active Problem List   Diagnosis Date Noted  . Liver cyst 09/27/2017   Past Medical History:  Diagnosis Date  . Anemia    iron deficiency anemia  . Anginal pain ([place])    a year or more, due to costochrondritis  . Dyspnea    due to liver cyst  . GERD (gastroesophageal reflux disease)   . Headache   . History of kidney stones     Past Surgical History:  Procedure Laterality Date  . ABDOMINAL HYSTERECTOMY    . BREAST EXCISIONAL BIOPSY Left   . CHOLECYSTECTOMY    . COLPOSCOPY    . EXTRACORPOREAL SHOCK WAVE LITHOTRIPSY Right 10/31/2017   Procedure: RIGHT EXTRACORPOREAL SHOCK WAVE LITHOTRIPSY (ESWL);  Surgeon: Ceasar Mons, MD;  Location: [facility];  Service: Urology;  Laterality: Right;  . EXTRACORPOREAL SHOCK WAVE LITHOTRIPSY Left 03/13/2018   Procedure: LEFT EXTRACORPOREAL SHOCK WAVE LITHOTRIPSY (ESWL);  Surgeon: Ceasar Mons, MD;  Location: [facility];  Service: Urology;  Laterality: Left;  . LAPAROSCOPIC LIVER CYST REMOVAL N/A 09/27/2017   Procedure: LAPAROSCOPIC LIVER CYST UNROOFING;  Surgeon: [name], MD;  Location: [facility];  Service: General;  Laterality: N/A;  . WISDOM TOOTH EXTRACTION      Medications Prior to Admission  Medication Sig Dispense Refill Last Dose  . Calcium Carbonate-Vitamin D (CALCIUM-D PO) Take 1 tablet by mouth daily.    Past Week at Unknown time  . estradiol (VIVELLE-DOT) 0.0375 MG/24HR Place 1 patch onto the skin once a week.   06/12/2018 at Unknown time  . Multiple Vitamin (MULTIVITAMIN WITH MINERALS) TABS tablet Take 1 tablet by mouth daily.     Past Week at Unknown time  . omeprazole (PRILOSEC) 20 MG capsule Take 20 mg by mouth daily as needed (acid reflux).    11/14/17   No Known Allergies  Social History   Tobacco Use  . Smoking status: Never Smoker  . Smokeless tobacco: Never Used  Substance Use Topics  . Alcohol use: No    Family History  Problem Relation Age of Onset  . Hypertension Mother   . Hypertension Father   . Hypertension Brother   . Breast cancer Paternal Aunt   . Diabetes Maternal Grandmother      Objective:   Patient Vitals for the past 8 hrs:  BP Temp Temp src Pulse Resp SpO2 Height Weight  06/12/18 0855 136/74 98.2 F (36.8 C) Oral 71 14 100 % 5\' 3"  (1.6 m) 65.8 kg   No intake/output data recorded. No intake/output data recorded.   See MD Preop evaluation      Assessment:   1.  Guaiac positive stool and iron deficiency anemia  Plan:   We will proceed with EGD and colonoscopy.  Have discussed in detail with the patient.  She has had both of these procedures done in the past.

## 2018-06-12 NOTE — Anesthesia Postprocedure Evaluation (Signed)
Anesthesia Post Note  Patient: [name]  Procedure(s) Performed: COLONOSCOPY WITH PROPOFOL (N/A ) ESOPHAGOGASTRODUODENOSCOPY (EGD) WITH PROPOFOL (N/A ) POLYPECTOMY     Patient location during evaluation: PACU Anesthesia Type: MAC Level of consciousness: awake and alert Pain management: pain level controlled Vital Signs Assessment: post-procedure vital signs reviewed and stable Respiratory status: spontaneous breathing, nonlabored ventilation and respiratory function stable Cardiovascular status: blood pressure returned to baseline and stable Postop Assessment: no apparent nausea or vomiting Anesthetic complications: no    Last Vitals:  Vitals:   06/12/18 1050 06/12/18 1100  BP: 105/62 115/60  Pulse: 74 75  Resp: 16 20  Temp:    SpO2: 100% 100%    Last Pain:  Vitals:   06/12/18 1100  TempSrc:   PainSc: 0-No pain                 Brennan Bailey

## 2018-06-13 ENCOUNTER — Encounter (HOSPITAL_COMMUNITY): Payer: Self-pay | Admitting: Gastroenterology

## 2019-03-28 ENCOUNTER — Other Ambulatory Visit: Payer: Self-pay | Admitting: Geriatric Medicine

## 2019-03-28 DIAGNOSIS — Z1231 Encounter for screening mammogram for malignant neoplasm of breast: Secondary | ICD-10-CM

## 2019-05-22 ENCOUNTER — Ambulatory Visit
Admission: RE | Admit: 2019-05-22 | Discharge: 2019-05-22 | Disposition: A | Discharge status: Home / Self Care | Payer: No Typology Code available for payment source | Source: Ambulatory Visit | Attending: Geriatric Medicine | Admitting: Geriatric Medicine

## 2019-05-22 ENCOUNTER — Other Ambulatory Visit: Payer: Self-pay

## 2019-05-22 DIAGNOSIS — Z1231 Encounter for screening mammogram for malignant neoplasm of breast: Secondary | ICD-10-CM

## 2019-08-20 IMAGING — CR DG CHEST 2V
2 series · 2 of 2 positions shown · non-contrast
Comparison: None.

CLINICAL DATA: Shortness of breath.

EXAM:
CHEST - 2 VIEW

[w chest pa]
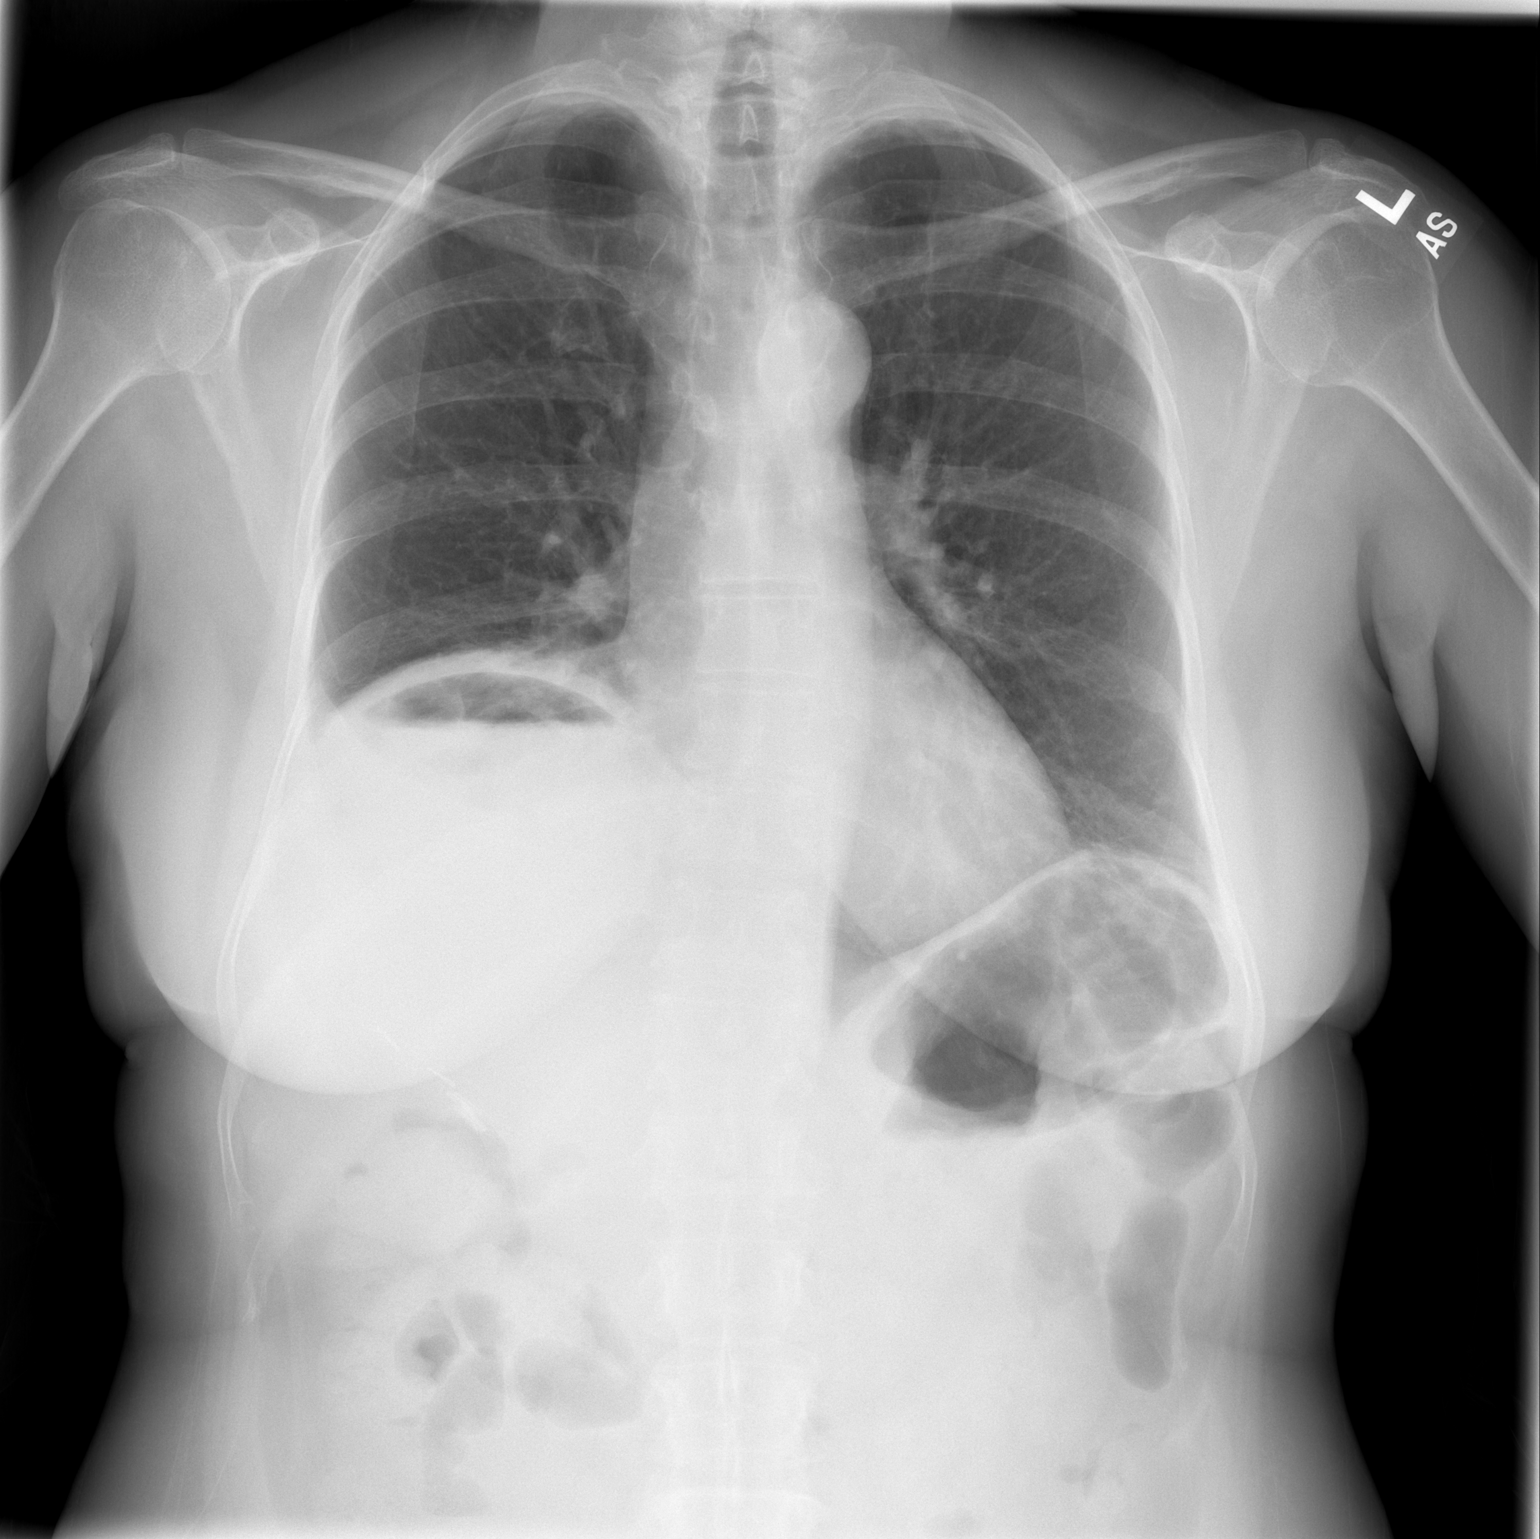

[w chest lat]
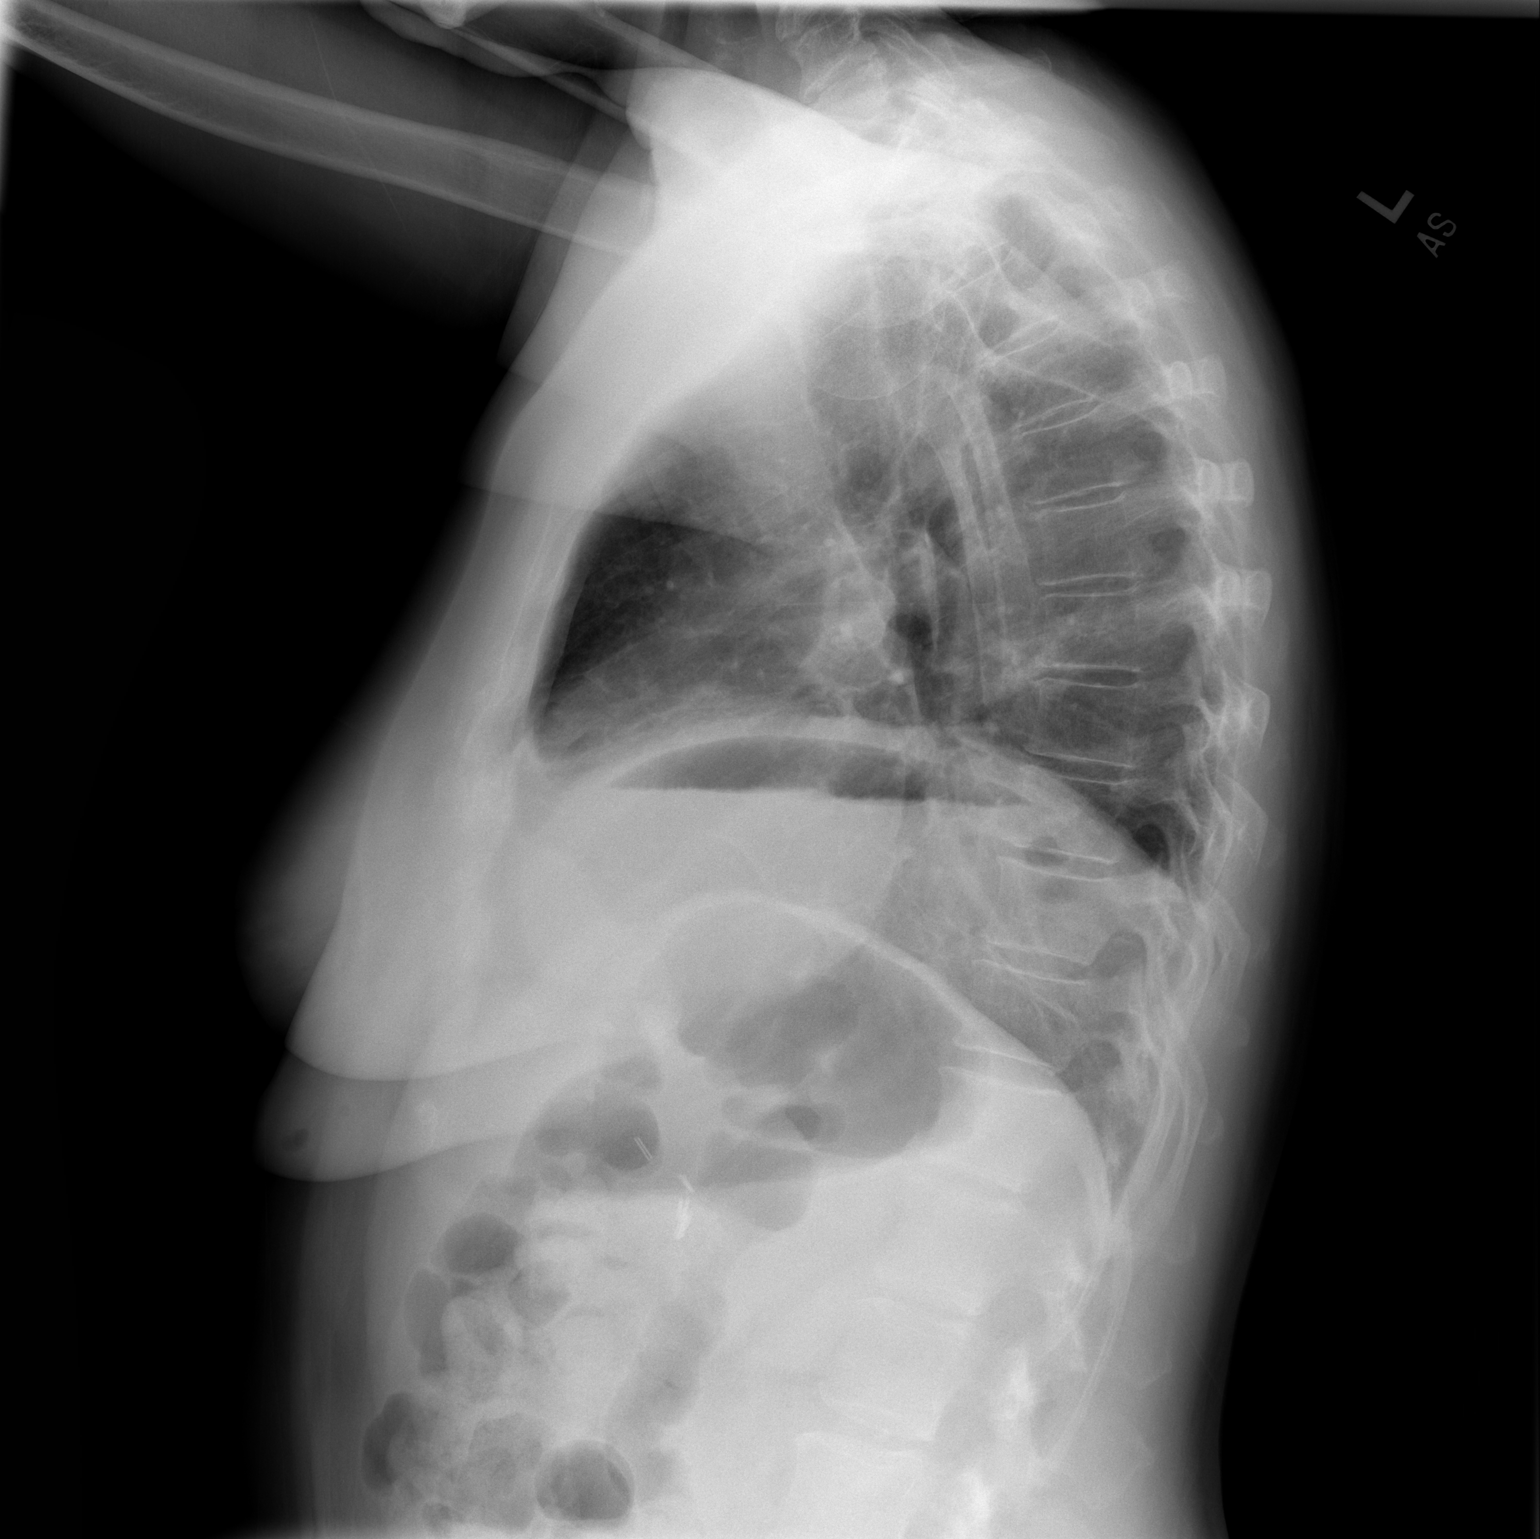

[2 of 2 positions shown; findings below may reference images not displayed]

FINDINGS: The heart size and mediastinal contours are within normal limits.
Both lungs are clear. No pneumothorax or pleural effusion is noted.
Elevated right hemidiaphragm is noted. The visualized skeletal
structures are unremarkable.
IMPRESSION: No active cardiopulmonary disease.

## 2019-10-04 ENCOUNTER — Other Ambulatory Visit: Payer: Self-pay | Admitting: Geriatric Medicine

## 2019-10-04 DIAGNOSIS — E278 Other specified disorders of adrenal gland: Secondary | ICD-10-CM

## 2019-10-17 ENCOUNTER — Other Ambulatory Visit: Payer: Self-pay

## 2019-10-17 ENCOUNTER — Ambulatory Visit (INDEPENDENT_AMBULATORY_CARE_PROVIDER_SITE_OTHER): Payer: Self-pay | Admitting: Ophthalmology

## 2019-10-17 ENCOUNTER — Ambulatory Visit
Admission: RE | Admit: 2019-10-17 | Discharge: 2019-10-17 | Disposition: A | Discharge status: Home / Self Care | Payer: No Typology Code available for payment source | Source: Ambulatory Visit | Attending: Geriatric Medicine | Admitting: Geriatric Medicine

## 2019-10-17 ENCOUNTER — Encounter (INDEPENDENT_AMBULATORY_CARE_PROVIDER_SITE_OTHER): Payer: Self-pay | Admitting: Ophthalmology

## 2019-10-17 DIAGNOSIS — E278 Other specified disorders of adrenal gland: Secondary | ICD-10-CM

## 2019-10-17 DIAGNOSIS — H3581 Retinal edema: Secondary | ICD-10-CM

## 2019-10-17 DIAGNOSIS — H25813 Combined forms of age-related cataract, bilateral: Secondary | ICD-10-CM

## 2019-10-17 DIAGNOSIS — H35373 Puckering of macula, bilateral: Secondary | ICD-10-CM

## 2019-10-17 NOTE — Progress Notes (Signed)
[facility] Note  10/17/2019     CHIEF COMPLAINT Patient presents for Retina Evaluation   HISTORY OF PRESENT ILLNESS: [name] is a 64 y.o. female who presents to the clinic today for:   HPI    Retina Evaluation    In both eyes.  Onset: Unknown.  Duration of 4 weeks.  Associated Symptoms Flashes and Floaters.  Context:  distance vision, mid-range vision and near vision.  Treatments tried include artificial tears.  Response to treatment was no improvement.  I, the attending physician,  performed the HPI with the patient and updated documentation appropriately.          Comments    64 y/o female pt referred by Dr. Orlinda Blalock, OD at [facility] on 09/15/19 for eval of mild ERM w/traction OU.  VA good OU cc, but OD is slightly blurred.  Denies pain; has had brief flashes OD in the past, and has a few small floaters OU.  No gtts.       Last edited by Bernarda Caffey, MD on 10/19/2019  4:21 PM. (History)    pt denies being hypertensive, diabetic or having high cholesterol, pt used to see Dr. Thalia Bloodgood, but he has retired so she is now seeing Dr. Orlinda Blalock, she states she occasionally sees flashes in her right eye only, she states her right eye is blurrier than her left eye, she got a new glasses rx last month, pt states the only medication she takes is estrogen, she denies eye trauma or eye sx  Referring physician: Concha Pyo, OD [address] [place],  [place] [address]  HISTORICAL INFORMATION:   Selected notes from the MEDICAL RECORD NUMBER Referred by Dr. Orlinda Blalock for concern of ERM OU LEE: 05.01.21 (L. Mazarella) Ocular Hx- PMH-   CURRENT MEDICATIONS: No current outpatient medications on file. (Ophthalmic Drugs)   No current facility-administered medications for this visit. (Ophthalmic Drugs)   Current Outpatient Medications (Other)  Medication Sig  . Calcium Carbonate-Vitamin D (CALCIUM-D PO) Take 1 tablet by mouth  daily.   . chlorhexidine (PERIDEX) 0.12 % solution 15 mLs 2 (two) times daily.  . Cobalamin Combinations (FOLTRATE PO) vitamin B12-folic acid  . cyclobenzaprine (FLEXERIL) 5 MG tablet Take 5 mg by mouth 2 (two) times daily as needed.  Marland Kitchen dexamethasone (DECADRON) 1 MG tablet Take 1 mg by mouth at bedtime.  Marland Kitchen estradiol (CLIMARA - DOSED IN MG/24 HR) 0.0375 mg/24hr patch 0.0375 mg once a week.  . estradiol (VIVELLE-DOT) 0.0375 MG/24HR Place 1 patch onto the skin once a week.  Marland Kitchen HYDROcodone-acetaminophen (HYCET) 7.5-325 mg/15 ml solution Take 10 mLs by mouth every 4 (four) hours as needed.  . Multiple Vitamin (MULTIVITAMIN WITH MINERALS) TABS tablet Take 1 tablet by mouth daily.   Marland Kitchen omeprazole (PRILOSEC) 20 MG capsule Take 20 mg by mouth daily as needed (acid reflux).   . Omeprazole (PRILOSEC PO) omeprazole   No current facility-administered medications for this visit. (Other)      REVIEW OF SYSTEMS: ROS    Positive for: Gastrointestinal, Eyes   Negative for: Constitutional, Neurological, Skin, Genitourinary, Musculoskeletal, HENT, Endocrine, Cardiovascular, Respiratory, Psychiatric, Allergic/Imm, Heme/Lymph   Last edited by Matthew Folks, COA on 10/17/2019  8:32 AM. (History)       ALLERGIES No Known Allergies  PAST MEDICAL HISTORY Past Medical History:  Diagnosis Date  . Anemia    iron deficiency anemia  . Anginal pain ([employer])    a  year or more, due to costochrondritis  . Cataract    OU  . Dyspnea    due to liver cyst  . GERD (gastroesophageal reflux disease)   . Headache   . History of kidney stones    Past Surgical History:  Procedure Laterality Date  . ABDOMINAL HYSTERECTOMY    . BREAST EXCISIONAL BIOPSY Left   . CHOLECYSTECTOMY    . COLONOSCOPY WITH PROPOFOL N/A 06/12/2018   Procedure: COLONOSCOPY WITH PROPOFOL;  Surgeon: Laurence Spates, MD;  [facility];  Service: Endoscopy;  Laterality: N/A;  . COLPOSCOPY    . ESOPHAGOGASTRODUODENOSCOPY (EGD) WITH  PROPOFOL N/A 06/12/2018   Procedure: ESOPHAGOGASTRODUODENOSCOPY (EGD) WITH PROPOFOL;  Surgeon: Laurence Spates, MD;  [facility];  Service: Endoscopy;  Laterality: N/A;  . EXTRACORPOREAL SHOCK WAVE LITHOTRIPSY Right 10/31/2017   Procedure: RIGHT EXTRACORPOREAL SHOCK WAVE LITHOTRIPSY (ESWL);  Surgeon: Ceasar Mons, MD;  [facility];  Service: Urology;  Laterality: Right;  . EXTRACORPOREAL SHOCK WAVE LITHOTRIPSY Left 03/13/2018   Procedure: LEFT EXTRACORPOREAL SHOCK WAVE LITHOTRIPSY (ESWL);  Surgeon: Ceasar Mons, MD;  [facility];  Service: Urology;  Laterality: Left;  . LAPAROSCOPIC LIVER CYST REMOVAL N/A 09/27/2017   Procedure: LAPAROSCOPIC LIVER CYST UNROOFING;  Surgeon: [name], MD;  Location: [facility];  Service: General;  Laterality: N/A;  . POLYPECTOMY  06/12/2018   Procedure: POLYPECTOMY;  Surgeon: Laurence Spates, MD;  [facility];  Service: Endoscopy;;  . WISDOM TOOTH EXTRACTION      FAMILY HISTORY Family History  Problem Relation Age of Onset  . Hypertension Mother   . Hypertension Father   . Hypertension Brother   . Breast cancer Paternal Aunt   . Diabetes Maternal Grandmother     SOCIAL HISTORY Social History   Tobacco Use  . Smoking status: Never Smoker  . Smokeless tobacco: Never Used  Substance Use Topics  . Alcohol use: No  . Drug use: No         OPHTHALMIC EXAM:  Base Eye Exam    Visual Acuity (Snellen - Linear)      Right Left   Dist cc 20/20 -2 20/20   Correction: Glasses       Tonometry (Tonopen, 8:34 AM)      Right Left   Pressure 17 17       Pupils      Dark Light Shape React APD   Right 3 2 Round Brisk None   Left 3 2 Round Brisk None       Visual Fields (Counting fingers)      Left Right    Full Full       Extraocular Movement      Right Left    Full, Ortho Full, Ortho       Neuro/Psych    Oriented x3: Yes   Mood/Affect: Normal       Dilation    Both eyes: 1.0%  Mydriacyl, 2.5% Phenylephrine @ 8:34 AM        Slit Lamp and Fundus Exam    Slit Lamp Exam      Right Left   Lids/Lashes Dermatochalasis - upper lid Dermatochalasis - upper lid   Conjunctiva/Sclera Nasal Pinguecula, mild Melanosis Nasal Pinguecula, mild Melanosis   Cornea Arcus, trace Punctate epithelial erosions Arcus, trace Punctate epithelial erosions   Anterior Chamber Deep and quiet Deep and quiet   Iris Round and dilated Round and dilated   Lens 2+ Nuclear sclerosis, 2+ Cortical cataract 2+ Nuclear  sclerosis, 2+ Cortical cataract   Vitreous Vitreous syneresis, Posterior vitreous detachment, vitreous condensations Vitreous syneresis       Fundus Exam      Right Left   Disc Pink and Sharp Pink and Sharp   C/D Ratio 0.5 0.4   Macula Flat, Blunted foveal reflex, Epiretinal membrane with early striae  Flat, good foveal reflex, mild Epiretinal membrane greatest superior to fovea   Vessels Vascular attenuation, mild Tortuousity Vascular attenuation, mild Tortuousity, mild AV crossing changes   Periphery Attached, pigmented inferior cystoid degeneration Attached, pigmented inferior cystoid degeneration        Refraction    Wearing Rx      Sphere Cylinder Axis Add   Right -0.25 +0.25 150 +2.25   Left -0.50 +0.75 165 +2.25   Age: 72m   Type: PAL       Manifest Refraction      Sphere Cylinder Axis Dist VA   Right [place] +0.25 150 20/20-2   Left -0.50 +0.75 165 20/20          IMAGING AND PROCEDURES  Imaging and Procedures for @TODAY @  OCT, Retina - OU - Both Eyes       Right Eye Quality was good. Central Foveal Thickness: 393. Progression has no prior data. Findings include abnormal foveal contour, no IRF, no SRF, epiretinal membrane, macular pucker.   Left Eye Quality was good. Central Foveal Thickness: 249. Progression has no prior data. Findings include normal foveal contour, no IRF, no SRF, epiretinal membrane, vitreomacular adhesion  (Mild focal ERM with early  pucker superior macula).   Notes *Images captured and stored on drive  Diagnosis / Impression:  ERM with pucker OU (OD>OS)  Clinical management:  See below  Abbreviations: NFP - Normal foveal profile. CME - cystoid macular edema. PED - pigment epithelial detachment. IRF - intraretinal fluid. SRF - subretinal fluid. EZ - ellipsoid zone. ERM - epiretinal membrane. ORA - outer retinal atrophy. ORT - outer retinal tubulation. SRHM - subretinal hyper-reflective material        CT ABDOMEN WO CONTRAST       CLINICAL DATA:  Follow-up adrenal lesion.  EXAM: CT ABDOMEN WITHOUT CONTRAST  TECHNIQUE: Multidetector CT imaging of the abdomen was performed following the standard protocol without IV contrast.  COMPARISON:  10/31/2017  FINDINGS: Lower chest: Clear lung bases. Normal heart size without pericardial or pleural effusion. Right coronary artery atherosclerosis.  Hepatobiliary: Surgical changes about the right lobe of the liver. A subcapsular 1.2 cm right hepatic lobe cyst is similar in size. Cholecystectomy, without biliary ductal dilatation.  Pancreas: Normal, without mass or ductal dilatation.  Spleen: Normal in size, without focal abnormality.  Adrenals/Urinary Tract: Normal right adrenal gland. Minimal left adrenal thickening and nodularity, without dominant mass.  Punctate left renal collecting system calculi.  No hydronephrosis.  Stomach/Bowel: Normal stomach, without wall thickening. Colonic stool burden suggests constipation. Normal abdominal small bowel.  Vascular/Lymphatic: Aortic atherosclerosis. No retroperitoneal or retrocrural adenopathy.  Other: No ascites.  Musculoskeletal: Degenerative disc disease at the lumbosacral junction. Moderate disc bulge at L4-5.  IMPRESSION: 1. No evidence of adrenal mass. 2.  No acute abdominal process. 3. Age advanced coronary artery atherosclerosis. Recommend assessment of coronary risk factors and consideration of  medical therapy. 4.  Possible constipation. 5. Left nephrolithiasis.   Electronically Signed   By: Abigail Miyamoto M.D.   On: 10/18/2019 10:54                 ASSESSMENT/PLAN:  ICD-10-CM   1. Epiretinal membrane (ERM) of both eyes  H35.373   2. Retinal edema  H35.81 OCT, Retina - OU - Both Eyes  3. Combined forms of age-related cataract of both eyes  H25.813     1,2. Epiretinal membrane, OU (OD>OS)  - The natural history, anatomy, potential for loss of vision, and treatment options including vitrectomy techniques and the complications of endophthalmitis, retinal detachment, vitreous hemorrhage, cataract progression and permanent vision loss discussed with the patient.  - mild ERM w/ BCVA 20/20 OU  - mild metamorphopsia OD  - no indication for surgery at this time  - monitor for now  - f/u 3-4 mos  3. Mixed form cataracts OU  - The symptoms of cataract, surgical options, and treatments and risks were discussed with patient.  - discussed diagnosis and progression  - not yet visually significant  - monitor for now    Ophthalmic Meds Ordered this visit:  No orders of the defined types were placed in this encounter.      Return for f/u 3-4 months, ERM OU, DFE, OCT.  There are no Patient Instructions on file for this visit.   Explained the diagnoses, plan, and follow up with the patient and they expressed understanding.  Patient expressed understanding of the importance of proper follow up care.   This document serves as a record of services personally performed by Gardiner Sleeper, MD, PhD. It was created on their behalf by Ernest Mallick, OA, an ophthalmic assistant. The creation of this record is the provider's dictation and/or activities during the visit.    Electronically signed by: Ernest Mallick, OA 06.02.2021 4:27 PM   Gardiner Sleeper, M.D., Ph.D. Diseases & Surgery of the Retina and Vitreous [facility]  I have reviewed the above  documentation for accuracy and completeness, and I agree with the above. Gardiner Sleeper, M.D., Ph.D. 10/19/19 4:27 PM   Abbreviations: M myopia (nearsighted); A astigmatism; H hyperopia (farsighted); P presbyopia; Mrx spectacle prescription;  CTL contact lenses; OD right eye; OS left eye; OU both eyes  XT exotropia; ET esotropia; PEK punctate epithelial keratitis; PEE punctate epithelial erosions; DES dry eye syndrome; MGD meibomian gland dysfunction; ATs artificial tears; PFAT's preservative free artificial tears; [place] nuclear sclerotic cataract; PSC posterior subcapsular cataract; ERM epi-retinal membrane; PVD posterior vitreous detachment; RD retinal detachment; DM diabetes mellitus; DR diabetic retinopathy; NPDR non-proliferative diabetic retinopathy; PDR proliferative diabetic retinopathy; CSME clinically significant macular edema; DME diabetic macular edema; dbh dot blot hemorrhages; CWS cotton wool spot; POAG primary open angle glaucoma; C/D cup-to-disc ratio; HVF humphrey visual field; GVF goldmann visual field; OCT optical coherence tomography; IOP intraocular pressure; BRVO Branch retinal vein occlusion; CRVO central retinal vein occlusion; CRAO central retinal artery occlusion; BRAO branch retinal artery occlusion; RT retinal tear; SB scleral buckle; PPV pars plana vitrectomy; VH Vitreous hemorrhage; PRP panretinal laser photocoagulation; IVK intravitreal kenalog; VMT vitreomacular traction; MH Macular hole;  NVD neovascularization of the disc; NVE neovascularization elsewhere; AREDS age related eye disease study; ARMD age related macular degeneration; POAG primary open angle glaucoma; EBMD epithelial/anterior basement membrane dystrophy; ACIOL anterior chamber intraocular lens; IOL intraocular lens; PCIOL posterior chamber intraocular lens; Phaco/IOL phacoemulsification with intraocular lens placement; [place] photorefractive keratectomy; LASIK laser assisted in situ keratomileusis; HTN hypertension; DM  diabetes mellitus; COPD chronic obstructive pulmonary disease

## 2019-10-19 ENCOUNTER — Encounter (INDEPENDENT_AMBULATORY_CARE_PROVIDER_SITE_OTHER): Payer: Self-pay | Admitting: Ophthalmology

## 2020-01-24 NOTE — Progress Notes (Signed)
[facility] Note  01/28/2020     CHIEF COMPLAINT Patient presents for Retina Follow Up   HISTORY OF PRESENT ILLNESS: [name] is a 64 y.o. female who presents to the clinic today for:   HPI    Retina Follow Up    Patient presents with  Other.  In both eyes.  This started 3 months ago.  Severity is moderate.  I, the attending physician,  performed the HPI with the patient and updated documentation appropriately.          Comments    Patient here for 3 months retina follow up for ERM OU. Patient states vision doing ok. Still gets blurred vision sometimes. Still gets flashing light OD temporally. No eye pain.       Last edited by Bernarda Caffey, MD on 01/28/2020  1:30 PM. (History)    pt states she is having intermittent blurry vision in her right eye, she states it will come and stay most of the day, but then it will clear up, pt states she has not been checking for distortion   Referring physician: Concha Pyo, OD [address] [place],  [place] [address]  HISTORICAL INFORMATION:   Selected notes from the MEDICAL RECORD NUMBER Referred by Dr. Orlinda Blalock for concern of ERM OU LEE: 05.01.21 (L. Mazarella) Ocular Hx- PMH-   CURRENT MEDICATIONS: No current outpatient medications on file. (Ophthalmic Drugs)   No current facility-administered medications for this visit. (Ophthalmic Drugs)   Current Outpatient Medications (Other)  Medication Sig  . Calcium Carbonate-Vitamin D (CALCIUM-D PO) Take 1 tablet by mouth daily.   . chlorhexidine (PERIDEX) 0.12 % solution 15 mLs 2 (two) times daily.  . Cobalamin Combinations (FOLTRATE PO) vitamin B12-folic acid  . cyclobenzaprine (FLEXERIL) 5 MG tablet Take 5 mg by mouth 2 (two) times daily as needed.  Marland Kitchen dexamethasone (DECADRON) 1 MG tablet Take 1 mg by mouth at bedtime.  Marland Kitchen estradiol (CLIMARA - DOSED IN MG/24 HR) 0.0375 mg/24hr patch 0.0375 mg once a week.  . estradiol  (VIVELLE-DOT) 0.0375 MG/24HR Place 1 patch onto the skin once a week.  Marland Kitchen HYDROcodone-acetaminophen (HYCET) 7.5-325 mg/15 ml solution Take 10 mLs by mouth every 4 (four) hours as needed.  . Multiple Vitamin (MULTIVITAMIN WITH MINERALS) TABS tablet Take 1 tablet by mouth daily.   . Omeprazole (PRILOSEC PO) omeprazole  . omeprazole (PRILOSEC) 20 MG capsule Take 20 mg by mouth daily as needed (acid reflux).    No current facility-administered medications for this visit. (Other)      REVIEW OF SYSTEMS: ROS    Positive for: Gastrointestinal, Eyes   Negative for: Constitutional, Neurological, Skin, Genitourinary, Musculoskeletal, HENT, Endocrine, Cardiovascular, Respiratory, Psychiatric, Allergic/Imm, Heme/Lymph   Last edited by Theodore Demark, COA on 01/28/2020  1:19 PM. (History)       ALLERGIES No Known Allergies  PAST MEDICAL HISTORY Past Medical History:  Diagnosis Date  . Anemia    iron deficiency anemia  . Anginal pain ([place])    a year or more, due to costochrondritis  . Cataract    OU  . Dyspnea    due to liver cyst  . GERD (gastroesophageal reflux disease)   . Headache   . History of kidney stones    Past Surgical History:  Procedure Laterality Date  . ABDOMINAL HYSTERECTOMY    . BREAST EXCISIONAL BIOPSY Left   . CHOLECYSTECTOMY    . COLONOSCOPY WITH PROPOFOL N/A  06/12/2018   Procedure: COLONOSCOPY WITH PROPOFOL;  Surgeon: Laurence Spates, MD;  Location: [facility];  Service: Endoscopy;  Laterality: N/A;  . COLPOSCOPY    . ESOPHAGOGASTRODUODENOSCOPY (EGD) WITH PROPOFOL N/A 06/12/2018   Procedure: ESOPHAGOGASTRODUODENOSCOPY (EGD) WITH PROPOFOL;  Surgeon: Laurence Spates, MD;  Location: [facility];  Service: Endoscopy;  Laterality: N/A;  . EXTRACORPOREAL SHOCK WAVE LITHOTRIPSY Right 10/31/2017   Procedure: RIGHT EXTRACORPOREAL SHOCK WAVE LITHOTRIPSY (ESWL);  Surgeon: Ceasar Mons, MD;  Location: [facility];  Service: Urology;  Laterality: Right;  .  EXTRACORPOREAL SHOCK WAVE LITHOTRIPSY Left 03/13/2018   Procedure: LEFT EXTRACORPOREAL SHOCK WAVE LITHOTRIPSY (ESWL);  Surgeon: Ceasar Mons, MD;  Location: [facility];  Service: Urology;  Laterality: Left;  . LAPAROSCOPIC LIVER CYST REMOVAL N/A 09/27/2017   Procedure: LAPAROSCOPIC LIVER CYST UNROOFING;  Surgeon: [name], MD;  Location: [facility];  Service: General;  Laterality: N/A;  . POLYPECTOMY  06/12/2018   Procedure: POLYPECTOMY;  Surgeon: Laurence Spates, MD;  [facility]: [facility];  Service: Endoscopy;;  . WISDOM TOOTH EXTRACTION      FAMILY HISTORY Family History  Problem Relation Age of Onset  . Hypertension Mother   . Hypertension Father   . Hypertension Brother   . Breast cancer Paternal Aunt   . Diabetes Maternal Grandmother     SOCIAL HISTORY Social History   Tobacco Use  . Smoking status: Never Smoker  . Smokeless tobacco: Never Used  Vaping Use  . Vaping Use: Never used  Substance Use Topics  . Alcohol use: No  . Drug use: No         OPHTHALMIC EXAM:  Base Eye Exam    Visual Acuity (Snellen - Linear)      Right Left   Dist cc 20/30 +1 20/20   Dist ph cc 20/30 +2    Correction: Glasses       Tonometry (Tonopen, 1:16 PM)      Right Left   Pressure 17 17       Pupils      Dark Light Shape React APD   Right 3 2 Round Brisk None   Left 3 2 Round Brisk None       Visual Fields (Counting fingers)      Left Right    Full Full       Extraocular Movement      Right Left    Full, Ortho Full, Ortho       Neuro/Psych    Oriented x3: Yes   Mood/Affect: Normal       Dilation    Both eyes: 1.0% Mydriacyl, 2.5% Phenylephrine @ 1:15 PM        Slit Lamp and Fundus Exam    Slit Lamp Exam      Right Left   Lids/Lashes Dermatochalasis - upper lid Dermatochalasis - upper lid   Conjunctiva/Sclera Nasal Pinguecula, mild Melanosis Nasal Pinguecula, mild Melanosis   Cornea Arcus, trace Punctate epithelial erosions Arcus, trace Punctate  epithelial erosions   Anterior Chamber Deep and quiet Deep and quiet   Iris Round and dilated Round and dilated   Lens 2-3+ Nuclear sclerosis, 2-3+ Cortical cataract 2-3+ Nuclear sclerosis, 2-3+ Cortical cataract   Vitreous Vitreous syneresis, Posterior vitreous detachment, vitreous condensations Vitreous syneresis       Fundus Exam      Right Left   Disc Pink and Sharp Pink and Sharp   C/D Ratio 0.5 0.4   Macula Flat, Blunted foveal reflex, Epiretinal membrane with striae,  no heme Flat, good foveal reflex, focal Epiretinal membrane with striae superior to fovea   Vessels Vascular attenuation, mild Tortuousity Vascular attenuation, mild Tortuousity, mild AV crossing changes   Periphery Attached, pigmented inferior cystoid degeneration, No RT/RD Attached, pigmented inferior cystoid degeneration        Refraction    Wearing Rx      Sphere Cylinder Axis Add   Right -0.25 +0.25 150 +2.25   Left -0.50 +0.75 165 +2.25   Type: PAL          IMAGING AND PROCEDURES  Imaging and Procedures for @TODAY @  OCT, Retina - OU - Both Eyes       Right Eye Quality was good. Central Foveal Thickness: 390. Progression has been stable. Findings include abnormal foveal contour, no IRF, no SRF, epiretinal membrane, macular pucker (?Mild progression of ERM centrally).   Left Eye Quality was good. Central Foveal Thickness: 246. Progression has been stable. Findings include normal foveal contour, no IRF, no SRF, epiretinal membrane, vitreomacular adhesion  (Mild focal ERM with early pucker superior macula).   Notes *Images captured and stored on drive  Diagnosis / Impression:  ERM with pucker OU (OD>OS) OD: ?Mild progression of ERM centrally OS: Mild focal ERM with early pucker superior macula -- stable from prior  Clinical management:  See below  Abbreviations: NFP - Normal foveal profile. CME - cystoid macular edema. PED - pigment epithelial detachment. IRF - intraretinal fluid. SRF -  subretinal fluid. EZ - ellipsoid zone. ERM - epiretinal membrane. ORA - outer retinal atrophy. ORT - outer retinal tubulation. SRHM - subretinal hyper-reflective material                 ASSESSMENT/PLAN:    ICD-10-CM   1. Epiretinal membrane (ERM) of both eyes  H35.373   2. Retinal edema  H35.81 OCT, Retina - OU - Both Eyes  3. Combined forms of age-related cataract of both eyes  H25.813     1,2. Epiretinal membrane, OU (OD>OS)  - mild ERM OU  - OD BCVA decreased to 20/30 from 20/20; OS remains 20/20  - OCT shows mild interval progression of central ERM OD, OS stable  - mild metamorphopsia OD  - discussed findings/progression/treatment  - recommend monitoring for now, but recommend cat consult  - f/u 3-4 mos, DFE, OCT  3. Mixed form cataracts OU  - The symptoms of cataract, surgical options, and treatments and risks were discussed with patient.  - discussed diagnosis and progression  - recommend CEIOL prior to PPV for ERM  - will refer to Dr. Eulas Post for cataract eval  Ophthalmic Meds Ordered this visit:  No orders of the defined types were placed in this encounter.     Return for f/u 3-4 months, ERM OU, DFE, OCT.  There are no Patient Instructions on file for this visit.  This document serves as a record of services personally performed by Gardiner Sleeper, MD, PhD. It was created on their behalf by Leeann Must, [place], an ophthalmic technician. The creation of this record is the provider's dictation and/or activities during the visit.    Electronically signed by: Leeann Must, [place] 09.09.2021 2:46 PM   This document serves as a record of services personally performed by Gardiner Sleeper, MD, PhD. It was created on their behalf by San Jetty. Owens Shark, OA an ophthalmic technician. The creation of this record is the provider's dictation and/or activities during the visit.    Electronically signed by: San Jetty. Owens Shark,  OA 09.13.2021 2:46 PM  Gardiner Sleeper, M.D.,  Ph.D. Diseases & Surgery of the Retina and [place] 01/28/2020   I have reviewed the above documentation for accuracy and completeness, and I agree with the above. Gardiner Sleeper, M.D., Ph.D. 01/28/20 2:46 PM  Abbreviations: M myopia (nearsighted); A astigmatism; H hyperopia (farsighted); P presbyopia; Mrx spectacle prescription;  CTL contact lenses; OD right eye; OS left eye; OU both eyes  XT exotropia; ET esotropia; PEK punctate epithelial keratitis; PEE punctate epithelial erosions; DES dry eye syndrome; MGD meibomian gland dysfunction; ATs artificial tears; PFAT's preservative free artificial tears; [place] nuclear sclerotic cataract; PSC posterior subcapsular cataract; ERM epi-retinal membrane; PVD posterior vitreous detachment; RD retinal detachment; DM diabetes mellitus; DR diabetic retinopathy; NPDR non-proliferative diabetic retinopathy; PDR proliferative diabetic retinopathy; CSME clinically significant macular edema; DME diabetic macular edema; dbh dot blot hemorrhages; CWS cotton wool spot; POAG primary open angle glaucoma; C/D cup-to-disc ratio; HVF humphrey visual field; GVF goldmann visual field; OCT optical coherence tomography; IOP intraocular pressure; BRVO Branch retinal vein occlusion; CRVO central retinal vein occlusion; CRAO central retinal artery occlusion; BRAO branch retinal artery occlusion; RT retinal tear; SB scleral buckle; PPV pars plana vitrectomy; VH Vitreous hemorrhage; PRP panretinal laser photocoagulation; IVK intravitreal kenalog; VMT vitreomacular traction; MH Macular hole;  NVD neovascularization of the disc; NVE neovascularization elsewhere; AREDS age related eye disease study; ARMD age related macular degeneration; POAG primary open angle glaucoma; EBMD epithelial/anterior basement membrane dystrophy; ACIOL anterior chamber intraocular lens; IOL intraocular lens; PCIOL posterior chamber intraocular lens; Phaco/IOL phacoemulsification  with intraocular lens placement; [place] photorefractive keratectomy; LASIK laser assisted in situ keratomileusis; HTN hypertension; DM diabetes mellitus; COPD chronic obstructive pulmonary disease

## 2020-01-28 ENCOUNTER — Encounter (INDEPENDENT_AMBULATORY_CARE_PROVIDER_SITE_OTHER): Payer: Self-pay | Admitting: Ophthalmology

## 2020-01-28 ENCOUNTER — Ambulatory Visit (INDEPENDENT_AMBULATORY_CARE_PROVIDER_SITE_OTHER): Payer: 59 | Admitting: Ophthalmology

## 2020-01-28 ENCOUNTER — Other Ambulatory Visit: Payer: Self-pay

## 2020-01-28 DIAGNOSIS — H25813 Combined forms of age-related cataract, bilateral: Secondary | ICD-10-CM | POA: Diagnosis not present

## 2020-01-28 DIAGNOSIS — H3581 Retinal edema: Secondary | ICD-10-CM | POA: Diagnosis not present

## 2020-01-28 DIAGNOSIS — H35373 Puckering of macula, bilateral: Secondary | ICD-10-CM

## 2020-04-22 ENCOUNTER — Other Ambulatory Visit: Payer: Self-pay | Admitting: Geriatric Medicine

## 2020-04-22 DIAGNOSIS — Z1231 Encounter for screening mammogram for malignant neoplasm of breast: Secondary | ICD-10-CM

## 2020-05-27 NOTE — Progress Notes (Signed)
[facility] Note  05/28/2020     CHIEF COMPLAINT Patient presents for Retina Follow Up   HISTORY OF PRESENT ILLNESS: [name] is a 65 y.o. female who presents to the clinic today for:   HPI    Retina Follow Up    Patient presents with  Other.  In both eyes.  Duration of 4 months.  Since onset it is stable.  I, the attending physician,  performed the HPI with the patient and updated documentation appropriately.          Comments    4 month follow up ERM OU- Patient is having cataract sx with Dr. Valetta Close 06/11/20 OD.  Vision appears stable OU. Using Systane 2-3x/d OU.       Last edited by Bernarda Caffey, MD on 05/28/2020  1:16 PM. (History)    pt states she is having cataract sx (OD) with Dr. Valetta Close at the end of January   Referring physician: Lajean Manes, [address] [place],  [place] [address]  HISTORICAL INFORMATION:   Selected notes from the MEDICAL RECORD NUMBER Referred by Dr. Orlinda Blalock for concern of ERM OU LEE: 05.01.21 (L. Mazarella) Ocular Hx- PMH-   CURRENT MEDICATIONS: No current outpatient medications on file. (Ophthalmic Drugs)   No current facility-administered medications for this visit. (Ophthalmic Drugs)   Current Outpatient Medications (Other)  Medication Sig  . atorvastatin (LIPITOR) 10 MG tablet Take 10 mg by mouth daily.  . Calcium Carbonate-Vitamin D (CALCIUM-D PO) Take 1 tablet by mouth daily.   . chlorhexidine (PERIDEX) 0.12 % solution 15 mLs 2 (two) times daily.  . Cobalamin Combinations (FOLTRATE PO) vitamin B12-folic acid  . dexamethasone (DECADRON) 1 MG tablet Take 1 mg by mouth at bedtime.  Marland Kitchen estradiol (CLIMARA - DOSED IN MG/24 HR) 0.0375 mg/24hr patch 0.0375 mg once a week.  . estradiol (VIVELLE-DOT) 0.0375 MG/24HR Place 1 patch onto the skin once a week.  . Multiple Vitamin (MULTIVITAMIN WITH MINERALS) TABS tablet Take 1 tablet by mouth daily.   . cyclobenzaprine (FLEXERIL) 5  MG tablet Take 5 mg by mouth 2 (two) times daily as needed. (Patient not taking: Reported on 05/28/2020)  . HYDROcodone-acetaminophen (HYCET) 7.5-325 mg/15 ml solution Take 10 mLs by mouth every 4 (four) hours as needed. (Patient not taking: Reported on 05/28/2020)  . Omeprazole (PRILOSEC PO) omeprazole (Patient not taking: Reported on 05/28/2020)  . omeprazole (PRILOSEC) 20 MG capsule Take 20 mg by mouth daily as needed (acid reflux).  (Patient not taking: Reported on 05/28/2020)   No current facility-administered medications for this visit. (Other)      REVIEW OF SYSTEMS: ROS    Positive for: Gastrointestinal, Eyes   Negative for: Constitutional, Neurological, Skin, Genitourinary, Musculoskeletal, HENT, Endocrine, Cardiovascular, Respiratory, Psychiatric, Allergic/Imm, Heme/Lymph   Last edited by Bernarda Caffey, MD on 05/28/2020  1:16 PM. (History)       ALLERGIES No Known Allergies  PAST MEDICAL HISTORY Past Medical History:  Diagnosis Date  . Anemia    iron deficiency anemia  . Anginal pain ([place])    a year or more, due to costochrondritis  . Cataract    OU  . Dyspnea    due to liver cyst  . GERD (gastroesophageal reflux disease)   . Headache   . History of kidney stones    Past Surgical History:  Procedure Laterality Date  . ABDOMINAL HYSTERECTOMY    . BREAST EXCISIONAL BIOPSY Left   .  CHOLECYSTECTOMY    . COLONOSCOPY WITH PROPOFOL N/A 06/12/2018   Procedure: COLONOSCOPY WITH PROPOFOL;  Surgeon: Laurence Spates, MD;  [facility];  Service: Endoscopy;  Laterality: N/A;  . COLPOSCOPY    . ESOPHAGOGASTRODUODENOSCOPY (EGD) WITH PROPOFOL N/A 06/12/2018   Procedure: ESOPHAGOGASTRODUODENOSCOPY (EGD) WITH PROPOFOL;  Surgeon: Laurence Spates, MD;  [facility];  Service: Endoscopy;  Laterality: N/A;  . EXTRACORPOREAL SHOCK WAVE LITHOTRIPSY Right 10/31/2017   Procedure: RIGHT EXTRACORPOREAL SHOCK WAVE LITHOTRIPSY (ESWL);  Surgeon: Ceasar Mons, MD;   [facility];  Service: Urology;  Laterality: Right;  . EXTRACORPOREAL SHOCK WAVE LITHOTRIPSY Left 03/13/2018   Procedure: LEFT EXTRACORPOREAL SHOCK WAVE LITHOTRIPSY (ESWL);  Surgeon: Ceasar Mons, MD;  [facility];  Service: Urology;  Laterality: Left;  . LAPAROSCOPIC LIVER CYST REMOVAL N/A 09/27/2017   Procedure: LAPAROSCOPIC LIVER CYST UNROOFING;  Surgeon: [name], MD;  Location: [facility];  Service: General;  Laterality: N/A;  . POLYPECTOMY  06/12/2018   Procedure: POLYPECTOMY;  Surgeon: Laurence Spates, MD;  [facility];  Service: Endoscopy;;  . WISDOM TOOTH EXTRACTION      FAMILY HISTORY Family History  Problem Relation Age of Onset  . Hypertension Mother   . Hypertension Father   . Hypertension Brother   . Breast cancer Paternal Aunt   . Diabetes Maternal Grandmother     SOCIAL HISTORY Social History   Tobacco Use  . Smoking status: Never Smoker  . Smokeless tobacco: Never Used  Vaping Use  . Vaping Use: Never used  Substance Use Topics  . Alcohol use: No  . Drug use: No         OPHTHALMIC EXAM:  Base Eye Exam    Visual Acuity (Snellen - Linear)      Right Left   Dist cc 20/25 -2 20/20   Dist ph cc NI        Tonometry (Tonopen, 1:06 PM)      Right Left   Pressure 10 9       Pupils      Dark Light Shape React APD   Right 3 2 Round Brisk None   Left 3 2 Round Brisk None       Visual Fields (Counting fingers)      Left Right    Full Full       Extraocular Movement      Right Left    Full Full       Neuro/Psych    Oriented x3: Yes   Mood/Affect: Normal       Dilation    Both eyes: 1.0% Mydriacyl, 2.5% Phenylephrine @ 1:06 PM        Slit Lamp and Fundus Exam    Slit Lamp Exam      Right Left   Lids/Lashes Dermatochalasis - upper lid Dermatochalasis - upper lid   Conjunctiva/Sclera Nasal Pinguecula, mild Melanosis Nasal Pinguecula, mild Melanosis   Cornea Arcus, trace Punctate epithelial erosions Arcus,  trace Punctate epithelial erosions   Anterior Chamber Deep and quiet Deep and quiet   Iris Round and dilated Round and dilated   Lens 2-3+ Nuclear sclerosis, 2-3+ Cortical cataract 2-3+ Nuclear sclerosis, 2-3+ Cortical cataract   Vitreous Vitreous syneresis, Posterior vitreous detachment, vitreous condensations Vitreous syneresis       Fundus Exam      Right Left   Disc Pink and Sharp Pink and Sharp   C/D Ratio 0.5 0.4   Macula Flat, Blunted foveal reflex, Epiretinal membrane  with striae, no heme Flat, good foveal reflex, focal Epiretinal membrane with striae superior to fovea, no heme   Vessels Mild attenuation, mild Tortuousity Mild attenuation, mild Tortuousity, mild AV crossing changes   Periphery Attached, pigmented inferior cystoid degeneration, No RT/RD Attached, pigmented inferior cystoid degeneration        Refraction    Wearing Rx      Sphere Cylinder Axis Add   Right -0.25 +0.25 150 +2.25   Left -0.50 +0.75 165 +2.25   Type: PAL          IMAGING AND PROCEDURES  Imaging and Procedures for @TODAY @  OCT, Retina - OU - Both Eyes       Right Eye Quality was good. Central Foveal Thickness: 401. Progression has been stable. Findings include abnormal foveal contour, no IRF, no SRF, epiretinal membrane, macular pucker (persistent ERM with pucker -- no significant change from prior).   Left Eye Quality was good. Central Foveal Thickness: 249. Progression has been stable. Findings include normal foveal contour, no IRF, no SRF, epiretinal membrane, vitreomacular adhesion  (Mild focal ERM with early pucker superior macula).   Notes *Images captured and stored on drive  Diagnosis / Impression:  ERM with pucker OU (OD>OS) OD: persistent ERM with pucker -- no significant change from prior OS: Mild focal ERM with early pucker superior macula -- stable from prior  Clinical management:  See below  Abbreviations: NFP - Normal foveal profile. CME - cystoid macular edema. PED  - pigment epithelial detachment. IRF - intraretinal fluid. SRF - subretinal fluid. EZ - ellipsoid zone. ERM - epiretinal membrane. ORA - outer retinal atrophy. ORT - outer retinal tubulation. SRHM - subretinal hyper-reflective material                 ASSESSMENT/PLAN:    ICD-10-CM   1. Epiretinal membrane (ERM) of both eyes  H35.373   2. Retinal edema  H35.81 OCT, Retina - OU - Both Eyes  3. Combined forms of age-related cataract of both eyes  H25.813     1,2. Epiretinal membrane, OU (OD>OS)  - mild ERM OU  - BCVA OD 20/25; OS remains 20/20  - OCT shows stable ERMs OU  - mild metamorphopsia OD  - discussed findings/progression/treatment  - recommend monitoring for now  - f/u 4 mos, DFE, OCT  3. Mixed form cataracts OU  - The symptoms of cataract, surgical options, and treatments and risks were discussed with patient.  - discussed diagnosis and progression  - recommend CEIOL prior to PPV for ERM  - surgery scheduled with Dr. Valetta Close on 01.26.22  Ophthalmic Meds Ordered this visit:  No orders of the defined types were placed in this encounter.     Return for f/u 3-4 months, ERM OU, DFE, OCT.  There are no Patient Instructions on file for this visit.  This document serves as a record of services personally performed by Gardiner Sleeper, MD, PhD. It was created on their behalf by Roselee Nova, [place]. The creation of this record is the provider's dictation and/or activities during the visit.  Electronically signed by: Roselee Nova, [place] 05/29/20 9:23 PM   This document serves as a record of services personally performed by Gardiner Sleeper, MD, PhD. It was created on their behalf by San Jetty. Owens Shark, OA an ophthalmic technician. The creation of this record is the provider's dictation and/or activities during the visit.    Electronically signed by: San Jetty. Owens Shark, [place] 01.12.2022 9:23 PM  Aaron Edelman  Antony Haste, M.D., Ph.D. Diseases & Surgery of the Retina and Vitreous [facility]  I have reviewed the above documentation for accuracy and completeness, and I agree with the above. Gardiner Sleeper, M.D., Ph.D. 05/29/20 9:23 PM   Abbreviations: M myopia (nearsighted); A astigmatism; H hyperopia (farsighted); P presbyopia; Mrx spectacle prescription;  CTL contact lenses; OD right eye; OS left eye; OU both eyes  XT exotropia; ET esotropia; PEK punctate epithelial keratitis; PEE punctate epithelial erosions; DES dry eye syndrome; MGD meibomian gland dysfunction; ATs artificial tears; PFAT's preservative free artificial tears; [place] nuclear sclerotic cataract; PSC posterior subcapsular cataract; ERM epi-retinal membrane; PVD posterior vitreous detachment; RD retinal detachment; DM diabetes mellitus; DR diabetic retinopathy; NPDR non-proliferative diabetic retinopathy; PDR proliferative diabetic retinopathy; CSME clinically significant macular edema; DME diabetic macular edema; dbh dot blot hemorrhages; CWS cotton wool spot; POAG primary open angle glaucoma; C/D cup-to-disc ratio; HVF humphrey visual field; GVF goldmann visual field; OCT optical coherence tomography; IOP intraocular pressure; BRVO Branch retinal vein occlusion; CRVO central retinal vein occlusion; CRAO central retinal artery occlusion; BRAO branch retinal artery occlusion; RT retinal tear; SB scleral buckle; PPV pars plana vitrectomy; VH Vitreous hemorrhage; PRP panretinal laser photocoagulation; IVK intravitreal kenalog; VMT vitreomacular traction; MH Macular hole;  NVD neovascularization of the disc; NVE neovascularization elsewhere; AREDS age related eye disease study; ARMD age related macular degeneration; POAG primary open angle glaucoma; EBMD epithelial/anterior basement membrane dystrophy; ACIOL anterior chamber intraocular lens; IOL intraocular lens; PCIOL posterior chamber intraocular lens; Phaco/IOL phacoemulsification with intraocular lens placement; [place] photorefractive keratectomy; LASIK laser  assisted in situ keratomileusis; HTN hypertension; DM diabetes mellitus; COPD chronic obstructive pulmonary disease

## 2020-05-28 ENCOUNTER — Other Ambulatory Visit: Payer: Self-pay

## 2020-05-28 ENCOUNTER — Ambulatory Visit (INDEPENDENT_AMBULATORY_CARE_PROVIDER_SITE_OTHER): Payer: 59 | Admitting: Ophthalmology

## 2020-05-28 ENCOUNTER — Encounter (INDEPENDENT_AMBULATORY_CARE_PROVIDER_SITE_OTHER): Payer: Self-pay | Admitting: Ophthalmology

## 2020-05-28 DIAGNOSIS — H35373 Puckering of macula, bilateral: Secondary | ICD-10-CM

## 2020-05-28 DIAGNOSIS — H25813 Combined forms of age-related cataract, bilateral: Secondary | ICD-10-CM

## 2020-05-28 DIAGNOSIS — H3581 Retinal edema: Secondary | ICD-10-CM

## 2020-06-04 ENCOUNTER — Other Ambulatory Visit: Payer: Self-pay | Admitting: Geriatric Medicine

## 2020-06-04 ENCOUNTER — Ambulatory Visit
Admission: RE | Admit: 2020-06-04 | Discharge: 2020-06-04 | Disposition: A | Discharge status: Home / Self Care | Payer: 59 | Source: Ambulatory Visit | Attending: Geriatric Medicine | Admitting: Geriatric Medicine

## 2020-06-04 ENCOUNTER — Other Ambulatory Visit: Payer: Self-pay

## 2020-06-04 DIAGNOSIS — N63 Unspecified lump in unspecified breast: Secondary | ICD-10-CM

## 2020-06-04 DIAGNOSIS — Z1231 Encounter for screening mammogram for malignant neoplasm of breast: Secondary | ICD-10-CM

## 2020-06-11 HISTORY — PX: EYE SURGERY: SHX253

## 2020-06-11 HISTORY — PX: CATARACT EXTRACTION: SUR2

## 2020-07-03 ENCOUNTER — Ambulatory Visit
Admission: RE | Admit: 2020-07-03 | Discharge: 2020-07-03 | Disposition: A | Discharge status: Home / Self Care | Payer: 59 | Source: Ambulatory Visit | Attending: Geriatric Medicine | Admitting: Geriatric Medicine

## 2020-07-03 ENCOUNTER — Other Ambulatory Visit: Payer: Self-pay

## 2020-07-03 DIAGNOSIS — N63 Unspecified lump in unspecified breast: Secondary | ICD-10-CM

## 2020-09-08 NOTE — Progress Notes (Signed)
[facility] Note  09/09/2020     CHIEF COMPLAINT Patient presents for Retina Follow Up   HISTORY OF PRESENT ILLNESS: [name] is a 65 y.o. female who presents to the clinic today for:   HPI    Retina Follow Up    Patient presents with  Other.  In both eyes.  This started months ago.  Severity is moderate.  Duration of 3.5 months.  Since onset it is stable.  I, the attending physician,  performed the HPI with the patient and updated documentation appropriately.          Comments    65 y/o female pt here for 3.5 mo f/u for ERM OU.  No change in [place] OU.  Had cat sx OD w/Dr. Valetta Close.  Still had very mild photopsias OD; sees rare FOL temporally OD.  Denies pain, floaters.  Systane Complete prn OU.       Last edited by Bernarda Caffey, MD on 09/11/2020 10:33 PM. (History)    pt states she has had cataract sx OD with Dr. Valetta Close since she was here last, she states she just saw him recently and got a new glasses rx, pt is seeing "cascading lights" in her right eye   Referring physician: Lajean Manes, [place]. [address] [place],  [place] [address]  HISTORICAL INFORMATION:   Selected notes from the MEDICAL RECORD NUMBER Referred by Dr. Orlinda Blalock for concern of ERM OU LEE: 05.01.21 (L. Mazarella) Ocular Hx- PMH-   CURRENT MEDICATIONS: No current outpatient medications on file. (Ophthalmic Drugs)   No current facility-administered medications for this visit. (Ophthalmic Drugs)   Current Outpatient Medications (Other)  Medication Sig  . atorvastatin (LIPITOR) 10 MG tablet Take 10 mg by mouth daily.  . Calcium Carbonate-Vitamin D (CALCIUM-D PO) Take 1 tablet by mouth daily.   . chlorhexidine (PERIDEX) 0.12 % solution 15 mLs 2 (two) times daily.  . Cobalamin Combinations (FOLTRATE PO) vitamin B12-folic acid  . Cyanocobalamin (VITAMIN B12) 1000 MCG TBCR 5 tablets  . cyclobenzaprine (FLEXERIL) 5 MG tablet Take 5 mg by mouth 2 (two)  times daily as needed.  Marland Kitchen dexamethasone (DECADRON) 1 MG tablet Take 1 mg by mouth at bedtime.  Marland Kitchen estradiol (CLIMARA - DOSED IN MG/24 HR) 0.0375 mg/24hr patch 0.0375 mg once a week.  . estradiol (VIVELLE-DOT) 0.0375 MG/24HR Place 1 patch onto the skin once a week.  Marland Kitchen HYDROcodone-acetaminophen (HYCET) 7.5-325 mg/15 ml solution Take 10 mLs by mouth every 4 (four) hours as needed.  . Multiple Vitamin (MULTIVITAMIN WITH MINERALS) TABS tablet Take 1 tablet by mouth daily.   . naproxen sodium (ALEVE) 220 MG tablet 1 tablet as needed  . Omeprazole (PRILOSEC PO)   . omeprazole (PRILOSEC) 20 MG capsule Take 20 mg by mouth daily as needed (acid reflux).  . pseudoephedrine (SUDAFED) 30 MG tablet 1 tablet as needed  . vitamin C (ASCORBIC ACID) 500 MG tablet 1 tablet   No current facility-administered medications for this visit. (Other)      REVIEW OF SYSTEMS: ROS    Positive for: Gastrointestinal, Eyes   Negative for: Constitutional, Neurological, Skin, Genitourinary, Musculoskeletal, HENT, Endocrine, Cardiovascular, Respiratory, Psychiatric, Allergic/Imm, Heme/Lymph   Last edited by Matthew Folks, COA on 09/09/2020  1:25 PM. (History)       ALLERGIES No Known Allergies  PAST MEDICAL HISTORY Past Medical History:  Diagnosis Date  . Anemia    iron deficiency anemia  . Anginal  pain ([place])    a year or more, due to costochrondritis  . Cataract    OS  . Dyspnea    due to liver cyst  . GERD (gastroesophageal reflux disease)   . Headache   . History of kidney stones    Past Surgical History:  Procedure Laterality Date  . ABDOMINAL HYSTERECTOMY    . BREAST EXCISIONAL BIOPSY Left   . CATARACT EXTRACTION Right 06/11/2020   Dr. Jola Schmidt  . CHOLECYSTECTOMY    . COLONOSCOPY WITH PROPOFOL N/A 06/12/2018   Procedure: COLONOSCOPY WITH PROPOFOL;  Surgeon: Laurence Spates, MD;  Location: [facility];  Service: Endoscopy;  Laterality: N/A;  . COLPOSCOPY    . ESOPHAGOGASTRODUODENOSCOPY  (EGD) WITH PROPOFOL N/A 06/12/2018   Procedure: ESOPHAGOGASTRODUODENOSCOPY (EGD) WITH PROPOFOL;  Surgeon: Laurence Spates, MD;  [facility];  Service: Endoscopy;  Laterality: N/A;  . EXTRACORPOREAL SHOCK WAVE LITHOTRIPSY Right 10/31/2017   Procedure: RIGHT EXTRACORPOREAL SHOCK WAVE LITHOTRIPSY (ESWL);  Surgeon: Ceasar Mons, MD;  Location: [facility];  Service: Urology;  Laterality: Right;  . EXTRACORPOREAL SHOCK WAVE LITHOTRIPSY Left 03/13/2018   Procedure: LEFT EXTRACORPOREAL SHOCK WAVE LITHOTRIPSY (ESWL);  Surgeon: Ceasar Mons, MD;  Location: [facility];  Service: Urology;  Laterality: Left;  . EYE SURGERY Right 06/11/2020   Cat Sx - Dr. Jola Schmidt  . LAPAROSCOPIC LIVER CYST REMOVAL N/A 09/27/2017   Procedure: LAPAROSCOPIC LIVER CYST UNROOFING;  Surgeon: [name], MD;  Location: [facility];  Service: General;  Laterality: N/A;  . POLYPECTOMY  06/12/2018   Procedure: POLYPECTOMY;  Surgeon: Laurence Spates, MD;  [facility];  Service: Endoscopy;;  . WISDOM TOOTH EXTRACTION      FAMILY HISTORY Family History  Problem Relation Age of Onset  . Hypertension Mother   . Hypertension Father   . Hypertension Brother   . Breast cancer Paternal Aunt   . Diabetes Maternal Grandmother     SOCIAL HISTORY Social History   Tobacco Use  . Smoking status: Never Smoker  . Smokeless tobacco: Never Used  Vaping Use  . Vaping Use: Never used  Substance Use Topics  . Alcohol use: No  . Drug use: No         OPHTHALMIC EXAM:  Base Eye Exam    Visual Acuity (Snellen - Linear)      Right Left   Dist cc 20/20 20/20   Correction: Glasses       Tonometry (Tonopen, 1:29 PM)      Right Left   Pressure 16 17       Pupils      Dark Light Shape React APD   Right 3 2 Round Brisk None   Left 3 2 Round Brisk None       Visual Fields (Counting fingers)      Left Right     Full       Extraocular Movement      Right Left    Full, Ortho Full, Ortho        Neuro/Psych    Oriented x3: Yes   Mood/Affect: Normal       Dilation    Both eyes: 1.0% Mydriacyl, 2.5% Phenylephrine @ 1:29 PM        Slit Lamp and Fundus Exam    Slit Lamp Exam      Right Left   Lids/Lashes Dermatochalasis - upper lid Dermatochalasis - upper lid   Conjunctiva/Sclera Nasal Pinguecula, mild Melanosis Nasal Pinguecula, mild Melanosis   Cornea Arcus,  well healed cataract wound superiorly Arcus, trace Punctate epithelial erosions   Anterior Chamber Deep and quiet Deep and quiet   Iris Round and dilated Round and dilated   Lens PC IOL in good position, trace Posterior capsular opacification 2-3+ Nuclear sclerosis, 2-3+ Cortical cataract   Vitreous Vitreous syneresis, Posterior vitreous detachment, vitreous condensations Vitreous syneresis       Fundus Exam      Right Left   Disc mild Pallor, Sharp rim Pink and Sharp   C/D Ratio 0.6 0.4   Macula Flat, Blunted foveal reflex, Epiretinal membrane with striae, no heme Flat, good foveal reflex, focal Epiretinal membrane with striae superior to fovea, no heme   Vessels attenuated, mild Copper wiring, mild AV crossing changes attenuated, mild Copper wiring, mild AV crossing changes   Periphery Attached, pigmented inferior cystoid degeneration, No RT/RD Attached, pigmented inferior cystoid degeneration          IMAGING AND PROCEDURES  Imaging and Procedures for @TODAY @  OCT, Retina - OU - Both Eyes       Right Eye Quality was good. Central Foveal Thickness: 417. Progression has been stable. Findings include abnormal foveal contour, no IRF, no SRF, epiretinal membrane, macular pucker (persistent ERM with pucker -- no significant change from prior).   Left Eye Quality was good. Central Foveal Thickness: 247. Progression has been stable. Findings include normal foveal contour, no IRF, no SRF, epiretinal membrane, vitreomacular adhesion  (Mild focal ERM with early pucker superior macula).   Notes *Images captured  and stored on drive  Diagnosis / Impression:  ERM with pucker OU (OD>OS) OD: persistent ERM with pucker -- no significant change from prior OS: Mild focal ERM with early pucker superior macula -- stable from prior  Clinical management:  See below  Abbreviations: NFP - Normal foveal profile. CME - cystoid macular edema. PED - pigment epithelial detachment. IRF - intraretinal fluid. SRF - subretinal fluid. EZ - ellipsoid zone. ERM - epiretinal membrane. ORA - outer retinal atrophy. ORT - outer retinal tubulation. SRHM - subretinal hyper-reflective material                 ASSESSMENT/PLAN:    ICD-10-CM   1. Epiretinal membrane (ERM) of both eyes  H35.373   2. Retinal edema  H35.81 OCT, Retina - OU - Both Eyes  3. Combined forms of age-related cataract of left eye  H25.812   4. Pseudophakia  Z96.1     1,2. Epiretinal membrane, OU (OD>OS)  - mild ERM OU  - BCVA 20/20 OU  - OCT shows stable ERMs OU  - mild metamorphopsia OD  - discussed findings/progression/treatment  - recommend monitoring for now  - f/u 4-6 mos, DFE, OCT  3. Mixed form cataracts OS - The symptoms of cataract, surgical options, and treatments and risks were discussed with patient. - discussed diagnosis and progression - under the expert management of Dr. Valetta Close  4. Pseudophakia OD  - s/p CE/IOL OD (Dr. Valetta Close, January 2022)  - IOL in good position, doing well - monitor  Ophthalmic Meds Ordered this visit:  No orders of the defined types were placed in this encounter.     Return for f/u 4-6 months ERM OU, DFE, OCT.  There are no Patient Instructions on file for this visit.  This document serves as a record of services personally performed by Gardiner Sleeper, MD, PhD. It was created on their behalf by Leeann Must, [place], an ophthalmic technician. The creation of this record  is the provider's dictation and/or activities during the visit.    Electronically signed by: Leeann Must, COA @TODAY @ 10:37  PM   This document serves as a record of services personally performed by Gardiner Sleeper, MD, PhD. It was created on their behalf by San Jetty. [place] Shark, OA an ophthalmic technician. The creation of this record is the provider's dictation and/or activities during the visit.    Electronically signed by: San Jetty. Marguerita Merles 04.26.2022 10:37 PM  Gardiner Sleeper, M.D., Ph.D. Diseases & Surgery of the Retina and [place] 09/09/2020   I have reviewed the above documentation for accuracy and completeness, and I agree with the above. Gardiner Sleeper, M.D., Ph.D. 09/11/20 10:37 PM   Abbreviations: M myopia (nearsighted); A astigmatism; H hyperopia (farsighted); P presbyopia; Mrx spectacle prescription;  CTL contact lenses; OD right eye; OS left eye; OU both eyes  XT exotropia; ET esotropia; PEK punctate epithelial keratitis; PEE punctate epithelial erosions; DES dry eye syndrome; MGD meibomian gland dysfunction; ATs artificial tears; PFAT's preservative free artificial tears; [place] nuclear sclerotic cataract; PSC posterior subcapsular cataract; ERM epi-retinal membrane; PVD posterior vitreous detachment; RD retinal detachment; DM diabetes mellitus; DR diabetic retinopathy; NPDR non-proliferative diabetic retinopathy; PDR proliferative diabetic retinopathy; CSME clinically significant macular edema; DME diabetic macular edema; dbh dot blot hemorrhages; CWS cotton wool spot; POAG primary open angle glaucoma; C/D cup-to-disc ratio; HVF humphrey visual field; GVF goldmann visual field; OCT optical coherence tomography; IOP intraocular pressure; BRVO Branch retinal vein occlusion; CRVO central retinal vein occlusion; CRAO central retinal artery occlusion; BRAO branch retinal artery occlusion; RT retinal tear; SB scleral buckle; PPV pars plana vitrectomy; VH Vitreous hemorrhage; PRP panretinal laser photocoagulation; IVK intravitreal kenalog; VMT vitreomacular traction; MH Macular hole;   NVD neovascularization of the disc; NVE neovascularization elsewhere; AREDS age related eye disease study; ARMD age related macular degeneration; POAG primary open angle glaucoma; EBMD epithelial/anterior basement membrane dystrophy; ACIOL anterior chamber intraocular lens; IOL intraocular lens; PCIOL posterior chamber intraocular lens; Phaco/IOL phacoemulsification with intraocular lens placement; [place] photorefractive keratectomy; LASIK laser assisted in situ keratomileusis; HTN hypertension; DM diabetes mellitus; COPD chronic obstructive pulmonary disease

## 2020-09-09 ENCOUNTER — Encounter (INDEPENDENT_AMBULATORY_CARE_PROVIDER_SITE_OTHER): Payer: Self-pay | Admitting: Ophthalmology

## 2020-09-09 ENCOUNTER — Ambulatory Visit (INDEPENDENT_AMBULATORY_CARE_PROVIDER_SITE_OTHER): Payer: 59 | Admitting: Ophthalmology

## 2020-09-09 ENCOUNTER — Other Ambulatory Visit: Payer: Self-pay

## 2020-09-09 DIAGNOSIS — H3581 Retinal edema: Secondary | ICD-10-CM | POA: Diagnosis not present

## 2020-09-09 DIAGNOSIS — H35373 Puckering of macula, bilateral: Secondary | ICD-10-CM

## 2020-09-09 DIAGNOSIS — Z961 Presence of intraocular lens: Secondary | ICD-10-CM

## 2020-09-09 DIAGNOSIS — H25812 Combined forms of age-related cataract, left eye: Secondary | ICD-10-CM | POA: Diagnosis not present

## 2020-09-09 DIAGNOSIS — H25813 Combined forms of age-related cataract, bilateral: Secondary | ICD-10-CM

## 2020-09-11 ENCOUNTER — Encounter (INDEPENDENT_AMBULATORY_CARE_PROVIDER_SITE_OTHER): Payer: Self-pay | Admitting: Ophthalmology

## 2021-02-05 NOTE — Progress Notes (Signed)
[facility] Note  02/09/2021     CHIEF COMPLAINT Patient presents for Retina Follow Up   HISTORY OF PRESENT ILLNESS: [name] is a 65 y.o. female who presents to the clinic today for:   HPI     Retina Follow Up   Patient presents with  Other.  In both eyes.  This started years ago.  Severity is mild.  Duration of 5 months.  Since onset it is stable.  I, the attending physician,  performed the HPI with the patient and updated documentation appropriately.        Comments   65 y/o female pt here for 5 mo f/u for ERM OU.  No change in [place] OU.  Denies pain, FOL, floaters.  AT QID OU.      Last edited by Bernarda Caffey, MD on 02/10/2021 12:38 PM.     pt states vision is about the same, waviness seems better, fol occasionally peripherally OD only   Referring physician: Jola Schmidt, MD [place],  [place] [address]  HISTORICAL INFORMATION:   Selected notes from the MEDICAL RECORD NUMBER Referred by Dr. Orlinda Blalock for concern of ERM OU LEE: 05.01.21 (L. Mazarella) Ocular Hx- PMH-   CURRENT MEDICATIONS: No current outpatient medications on file. (Ophthalmic Drugs)   No current facility-administered medications for this visit. (Ophthalmic Drugs)   Current Outpatient Medications (Other)  Medication Sig   atorvastatin (LIPITOR) 10 MG tablet Take 10 mg by mouth daily.   Calcium Carbonate-Vitamin D (CALCIUM-D PO) Take 1 tablet by mouth daily.    chlorhexidine (PERIDEX) 0.12 % solution 15 mLs 2 (two) times daily.   Cobalamin Combinations (FOLTRATE PO) vitamin B12-folic acid   Cyanocobalamin (VITAMIN B12) 1000 MCG TBCR 5 tablets   cyclobenzaprine (FLEXERIL) 5 MG tablet Take 5 mg by mouth 2 (two) times daily as needed.   dexamethasone (DECADRON) 1 MG tablet Take 1 mg by mouth at bedtime.   estradiol (CLIMARA - DOSED IN MG/24 HR) 0.0375 mg/24hr patch 0.0375 mg once a week.   estradiol (VIVELLE-DOT) 0.0375 MG/24HR Place 1 patch  onto the skin once a week.   HYDROcodone-acetaminophen (HYCET) 7.5-325 mg/15 ml solution Take 10 mLs by mouth every 4 (four) hours as needed.   Multiple Vitamin (MULTIVITAMIN WITH MINERALS) TABS tablet Take 1 tablet by mouth daily.    naproxen sodium (ALEVE) 220 MG tablet 1 tablet as needed   Omeprazole (PRILOSEC PO)    omeprazole (PRILOSEC) 20 MG capsule Take 20 mg by mouth daily as needed (acid reflux).   pseudoephedrine (SUDAFED) 30 MG tablet 1 tablet as needed   vitamin C (ASCORBIC ACID) 500 MG tablet 1 tablet   No current facility-administered medications for this visit. (Other)      REVIEW OF SYSTEMS: ROS   Positive for: Gastrointestinal, Eyes Negative for: Constitutional, Neurological, Skin, Genitourinary, Musculoskeletal, HENT, Endocrine, Cardiovascular, Respiratory, Psychiatric, Allergic/Imm, Heme/Lymph Last edited by Matthew Folks, COA on 02/09/2021 12:58 PM.        ALLERGIES No Known Allergies  PAST MEDICAL HISTORY Past Medical History:  Diagnosis Date   Anemia    iron deficiency anemia   Anginal pain ([place])    a year or more, due to costochrondritis   Cataract    OS   Dyspnea    due to liver cyst   GERD (gastroesophageal reflux disease)    Headache    History of kidney stones    Past Surgical History:  Procedure  Laterality Date   ABDOMINAL HYSTERECTOMY     BREAST EXCISIONAL BIOPSY Left    CATARACT EXTRACTION Right 06/11/2020   Dr. Jola Schmidt   CHOLECYSTECTOMY     COLONOSCOPY WITH PROPOFOL N/A 06/12/2018   Procedure: COLONOSCOPY WITH PROPOFOL;  Surgeon: Laurence Spates, MD;  [facility];  Service: Endoscopy;  Laterality: N/A;   COLPOSCOPY     ESOPHAGOGASTRODUODENOSCOPY (EGD) WITH PROPOFOL N/A 06/12/2018   Procedure: ESOPHAGOGASTRODUODENOSCOPY (EGD) WITH PROPOFOL;  Surgeon: Laurence Spates, MD;  [facility];  Service: Endoscopy;  Laterality: N/A;   EXTRACORPOREAL SHOCK WAVE LITHOTRIPSY Right 10/31/2017   Procedure: RIGHT  EXTRACORPOREAL SHOCK WAVE LITHOTRIPSY (ESWL);  Surgeon: Ceasar Mons, MD;  [facility]: [facility];  Service: Urology;  Laterality: Right;   EXTRACORPOREAL SHOCK WAVE LITHOTRIPSY Left 03/13/2018   Procedure: LEFT EXTRACORPOREAL SHOCK WAVE LITHOTRIPSY (ESWL);  Surgeon: Ceasar Mons, MD;  [facility];  Service: Urology;  Laterality: Left;   EYE SURGERY Right 06/11/2020   Cat Sx - Dr. Jola Schmidt   LAPAROSCOPIC LIVER CYST REMOVAL N/A 09/27/2017   Procedure: LAPAROSCOPIC LIVER CYST UNROOFING;  Surgeon: [name], MD;  Location: [facility];  Service: General;  Laterality: N/A;   POLYPECTOMY  06/12/2018   Procedure: POLYPECTOMY;  Surgeon: Laurence Spates, MD;  [facility];  Service: Endoscopy;;   WISDOM TOOTH EXTRACTION      FAMILY HISTORY Family History  Problem Relation Age of Onset   Hypertension Mother    Hypertension Father    Hypertension Brother    Breast cancer Paternal Aunt    Diabetes Maternal Grandmother     SOCIAL HISTORY Social History   Tobacco Use   Smoking status: Never   Smokeless tobacco: Never  Vaping Use   Vaping Use: Never used  Substance Use Topics   Alcohol use: No   Drug use: No       OPHTHALMIC EXAM:  Base Eye Exam     Visual Acuity (Snellen - Linear)       Right Left   Dist cc 20/20 - 20/20    Correction: Glasses         Tonometry (Tonopen, 12:59 PM)       Right Left   Pressure 13 13         Pupils       Dark Light Shape React APD   Right 3 2 Round Brisk None   Left 3 2 Round Brisk None         Visual Fields (Counting fingers)       Left Right    Full Full         Extraocular Movement       Right Left    Full, Ortho Full, Ortho         Neuro/Psych     Oriented x3: Yes   Mood/Affect: Normal         Dilation     Both eyes: 1.0% Mydriacyl, 2.5% Phenylephrine @ 12:59 PM           Slit Lamp and Fundus Exam     Slit Lamp Exam       Right Left   Lids/Lashes  Dermatochalasis - upper lid Dermatochalasis - upper lid   Conjunctiva/Sclera Nasal Pinguecula, mild Melanosis Nasal Pinguecula, mild Melanosis   Cornea Arcus, well healed cataract wound superiorly, early band K nasally Arcus, trace Punctate epithelial erosions, mild tear film debris   Anterior Chamber Deep and quiet Deep and quiet   Iris  Round and dilated Round and dilated   Lens PC IOL in good position, trace Posterior capsular opacification 2-3+ Nuclear sclerosis with brunescence, 2-3+ Cortical cataract   Vitreous Vitreous syneresis, Posterior vitreous detachment, vitreous condensations Vitreous syneresis         Fundus Exam       Right Left   Disc mild Pallor, Sharp rim Pink and Sharp   C/D Ratio 0.6 0.4   Macula Flat, Blunted foveal reflex, Epiretinal membrane with striae, no heme Flat, good foveal reflex, focal Epiretinal membrane with striae superior to fovea, no heme, RPE mottling   Vessels attenuated, mild AV crossing changes, mild tortuousity attenuated, mild AV crossing changes, mild tortuousity   Periphery Attached, pigmented inferior cystoid degeneration, No RT/RD, No heme  Attached, pigmented inferior cystoid degeneration, No heme, No RT/RD            IMAGING AND PROCEDURES  Imaging and Procedures for @TODAY @  OCT, Retina - OU - Both Eyes       Right Eye Quality was good. Central Foveal Thickness: 408. Progression has been stable. Findings include abnormal foveal contour, no IRF, no SRF, epiretinal membrane, macular pucker (?mild thickening of ERM).   Left Eye Quality was good. Central Foveal Thickness: 248. Progression has been stable. Findings include normal foveal contour, no IRF, no SRF, epiretinal membrane, vitreomacular adhesion (Mild focal ERM with early pucker superior macula).   Notes *Images captured and stored on drive  Diagnosis / Impression:  ERM with pucker OU (OD>OS) OD: ?mild thickening of ERM OS: Mild focal ERM with early pucker superior  macula -- stable from prior  Clinical management:  See below  Abbreviations: NFP - Normal foveal profile. CME - cystoid macular edema. PED - pigment epithelial detachment. IRF - intraretinal fluid. SRF - subretinal fluid. EZ - ellipsoid zone. ERM - epiretinal membrane. ORA - outer retinal atrophy. ORT - outer retinal tubulation. SRHM - subretinal hyper-reflective material            ASSESSMENT/PLAN:    ICD-10-CM   1. Epiretinal membrane (ERM) of both eyes  H35.373     2. Retinal edema  H35.81 OCT, Retina - OU - Both Eyes    3. Combined forms of age-related cataract of left eye  H25.812     4. Pseudophakia  Z96.1     5. Combined forms of age-related cataract of both eyes  H25.813       1,2. Epiretinal membrane, OU (OD>OS)  - mild ERM OU  - BCVA 20/20 OU  - OCT shows stable ERMs OU  - mild metamorphopsia OD -- pt reports subjective interval improvement  - discussed findings/progression/treatment  - recommend monitoring for now  - f/u 6-9  mos, DFE, OCT  3. Mixed form cataracts OS - The symptoms of cataract, surgical options, and treatments and risks were discussed with patient. - discussed diagnosis and progression - under the expert management of Dr. Valetta Close  4. Pseudophakia OD  - s/p CE/IOL OD (Dr. Valetta Close, January 2022)  - IOL in good position, doing well - monitor  Ophthalmic Meds Ordered this visit:  No orders of the defined types were placed in this encounter.     Return for f/u 6-9 months, ERM OU, DFE, OCT.  There are no Patient Instructions on file for this visit.  This document serves as a record of services personally performed by Gardiner Sleeper, MD, PhD. It was created on their behalf by Leeann Must, [place], an ophthalmic technician. The creation  of this record is the provider's dictation and/or activities during the visit.    Electronically signed by: Leeann Must, COA @TODAY @ 12:42 PM  This document serves as a record of services personally  performed by Gardiner Sleeper, MD, PhD. It was created on their behalf by San Jetty. Owens Shark, OA an ophthalmic technician. The creation of this record is the provider's dictation and/or activities during the visit.    Electronically signed by: San Jetty. Marguerita Merles 09.26.2022 12:42 PM  Gardiner Sleeper, M.D., Ph.D. Diseases & Surgery of the Retina and [place] 02/09/2021   I have reviewed the above documentation for accuracy and completeness, and I agree with the above. Gardiner Sleeper, M.D., Ph.D. 02/10/21 12:42 PM  Abbreviations: M myopia (nearsighted); A astigmatism; H hyperopia (farsighted); P presbyopia; Mrx spectacle prescription;  CTL contact lenses; OD right eye; OS left eye; OU both eyes  XT exotropia; ET esotropia; PEK punctate epithelial keratitis; PEE punctate epithelial erosions; DES dry eye syndrome; MGD meibomian gland dysfunction; ATs artificial tears; PFAT's preservative free artificial tears; [place] nuclear sclerotic cataract; PSC posterior subcapsular cataract; ERM epi-retinal membrane; PVD posterior vitreous detachment; RD retinal detachment; DM diabetes mellitus; DR diabetic retinopathy; NPDR non-proliferative diabetic retinopathy; PDR proliferative diabetic retinopathy; CSME clinically significant macular edema; DME diabetic macular edema; dbh dot blot hemorrhages; CWS cotton wool spot; POAG primary open angle glaucoma; C/D cup-to-disc ratio; HVF humphrey visual field; GVF goldmann visual field; OCT optical coherence tomography; IOP intraocular pressure; BRVO Branch retinal vein occlusion; CRVO central retinal vein occlusion; CRAO central retinal artery occlusion; BRAO branch retinal artery occlusion; RT retinal tear; SB scleral buckle; PPV pars plana vitrectomy; VH Vitreous hemorrhage; PRP panretinal laser photocoagulation; IVK intravitreal kenalog; VMT vitreomacular traction; MH Macular hole;  NVD neovascularization of the disc; NVE neovascularization  elsewhere; AREDS age related eye disease study; ARMD age related macular degeneration; POAG primary open angle glaucoma; EBMD epithelial/anterior basement membrane dystrophy; ACIOL anterior chamber intraocular lens; IOL intraocular lens; PCIOL posterior chamber intraocular lens; Phaco/IOL phacoemulsification with intraocular lens placement; [place] photorefractive keratectomy; LASIK laser assisted in situ keratomileusis; HTN hypertension; DM diabetes mellitus; COPD chronic obstructive pulmonary disease

## 2021-02-09 ENCOUNTER — Encounter (INDEPENDENT_AMBULATORY_CARE_PROVIDER_SITE_OTHER): Payer: Self-pay | Admitting: Ophthalmology

## 2021-02-09 ENCOUNTER — Other Ambulatory Visit: Payer: Self-pay

## 2021-02-09 ENCOUNTER — Ambulatory Visit (INDEPENDENT_AMBULATORY_CARE_PROVIDER_SITE_OTHER): Payer: 59 | Admitting: Ophthalmology

## 2021-02-09 DIAGNOSIS — H25813 Combined forms of age-related cataract, bilateral: Secondary | ICD-10-CM

## 2021-02-09 DIAGNOSIS — H3581 Retinal edema: Secondary | ICD-10-CM

## 2021-02-09 DIAGNOSIS — H35373 Puckering of macula, bilateral: Secondary | ICD-10-CM | POA: Diagnosis not present

## 2021-02-09 DIAGNOSIS — H25812 Combined forms of age-related cataract, left eye: Secondary | ICD-10-CM | POA: Diagnosis not present

## 2021-02-09 DIAGNOSIS — Z961 Presence of intraocular lens: Secondary | ICD-10-CM | POA: Diagnosis not present

## 2021-02-10 ENCOUNTER — Encounter (INDEPENDENT_AMBULATORY_CARE_PROVIDER_SITE_OTHER): Payer: Self-pay | Admitting: Ophthalmology

## 2021-06-04 ENCOUNTER — Other Ambulatory Visit: Payer: Self-pay | Admitting: Geriatric Medicine

## 2021-06-04 DIAGNOSIS — Z1231 Encounter for screening mammogram for malignant neoplasm of breast: Secondary | ICD-10-CM

## 2021-07-06 ENCOUNTER — Ambulatory Visit
Admission: RE | Admit: 2021-07-06 | Discharge: 2021-07-06 | Disposition: A | Discharge status: Home / Self Care | Payer: 59 | Source: Ambulatory Visit | Attending: Geriatric Medicine | Admitting: Geriatric Medicine

## 2021-07-06 DIAGNOSIS — Z1231 Encounter for screening mammogram for malignant neoplasm of breast: Secondary | ICD-10-CM | POA: Diagnosis not present

## 2021-09-04 NOTE — Progress Notes (Signed)
?[facility] Note ? ?09/09/2021 ? ?  ? ?CHIEF COMPLAINT ?Patient presents for Retina Follow Up ? ? ? ?HISTORY OF PRESENT ILLNESS: ?[name] is a 66 y.o. female who presents to the clinic today for:  ? ?HPI   ? ? 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  This started years ago.  Severity is mild.  Duration of 7 months.  Since onset it is stable.  I, the attending physician,  performed the HPI with the patient and updated documentation appropriately. ? ?  ?  ? ? Comments   ?Patient feels that the vision in the right eye has a slight bend and blur when looking at the [place] but nothing new or worse. She states that she sees the flashes in the right eye. She feels that the eyes can feel dry, gritty, and or scratchy at times. She uses Systane OU PRN- rare. ? ?  ?  ?Last edited by Bernarda Caffey, MD on 09/15/2021  5:45 PM.  ?  ?Pt states she saw Dr. Valetta Close yesterday, she got a good report, eyeglass rx has not changed, pt states vision is stable, she sees occasional fol at night only, but the "waterfall" in her vision is not there anymore, no new health concerns ? ?Referring physician: ?Jola Schmidt, MD ?[facility] ?[place],  [place] [address] ? ?HISTORICAL INFORMATION:  ? ?Selected notes from the [place] ?Referred by Dr. Orlinda Blalock for concern of ERM OU ?LEE: 05.01.21 (L. Mazarella) ?Ocular Hx- ?PMH-  ? ?CURRENT MEDICATIONS: ?No current outpatient medications on file. (Ophthalmic Drugs)  ? ?No current facility-administered medications for this visit. (Ophthalmic Drugs)  ? ?Current Outpatient Medications (Other)  ?Medication Sig  ? atorvastatin (LIPITOR) 10 MG tablet Take 10 mg by mouth daily.  ? Calcium Carbonate-Vitamin D (CALCIUM-D PO) Take 1 tablet by mouth daily.   ? chlorhexidine (PERIDEX) 0.12 % solution 15 mLs 2 (two) times daily.  ? Cobalamin Combinations (FOLTRATE PO) vitamin B12-folic acid  ? Cyanocobalamin (VITAMIN B12)  1000 MCG TBCR 5 tablets  ? cyclobenzaprine (FLEXERIL) 5 MG tablet Take 5 mg by mouth 2 (two) times daily as needed.  ? dexamethasone (DECADRON) 1 MG tablet Take 1 mg by mouth at bedtime.  ? estradiol (CLIMARA - DOSED IN MG/24 HR) 0.0375 mg/24hr patch 0.0375 mg once a week.  ? estradiol (VIVELLE-DOT) 0.0375 MG/24HR Place 1 patch onto the skin once a week.  ? HYDROcodone-acetaminophen (HYCET) 7.5-325 mg/15 ml solution Take 10 mLs by mouth every 4 (four) hours as needed.  ? Multiple Vitamin (MULTIVITAMIN WITH MINERALS) TABS tablet Take 1 tablet by mouth daily.   ? naproxen sodium (ALEVE) 220 MG tablet 1 tablet as needed  ? Omeprazole (PRILOSEC PO)   ? omeprazole (PRILOSEC) 20 MG capsule Take 20 mg by mouth daily as needed (acid reflux).  ? pseudoephedrine (SUDAFED) 30 MG tablet 1 tablet as needed  ? vitamin C (ASCORBIC ACID) 500 MG tablet 1 tablet  ? ?No current facility-administered medications for this visit. (Other)  ? ?REVIEW OF SYSTEMS: ?ROS   ?Positive for: Gastrointestinal, Eyes ?Negative for: Constitutional, Neurological, Skin, Genitourinary, Musculoskeletal, HENT, Endocrine, Cardiovascular, Respiratory, Psychiatric, Allergic/Imm, Heme/Lymph ?Last edited by Annie Paras, COT on 09/09/2021  1:03 PM.  ?  ? ?ALLERGIES ?No Known Allergies ? ?PAST MEDICAL HISTORY ?Past Medical History:  ?Diagnosis Date  ? Anemia   ? iron deficiency anemia  ?  Anginal pain ([place])   ? 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 Cataract   ? OS  ? Dyspnea   ? due to liver cyst  ? GERD (gastroesophageal reflux disease)   ? Headache   ? History of kidney stones   ? ?Past Surgical History:  ?Procedure Laterality Date  ? ABDOMINAL HYSTERECTOMY    ? BREAST EXCISIONAL BIOPSY Left   ? CATARACT EXTRACTION Right 06/11/2020  ? Dr. Jola Schmidt  ? CHOLECYSTECTOMY    ? COLONOSCOPY WITH PROPOFOL N/A 06/12/2018  ? Procedure: COLONOSCOPY WITH PROPOFOL;  Surgeon: Laurence Spates, MD;  Location: [facility];  Service: Endoscopy;  Laterality: N/A;   ? COLPOSCOPY    ? ESOPHAGOGASTRODUODENOSCOPY (EGD) WITH PROPOFOL N/A 06/12/2018  ? Procedure: ESOPHAGOGASTRODUODENOSCOPY (EGD) WITH PROPOFOL;  Surgeon: Laurence Spates, MD;  Location: [facility];  Service: Endoscopy;  Laterality: N/A;  ? EXTRACORPOREAL SHOCK WAVE LITHOTRIPSY Right 10/31/2017  ? Procedure: RIGHT EXTRACORPOREAL SHOCK WAVE LITHOTRIPSY (ESWL);  Surgeon: Ceasar Mons, MD;  Location: [facility];  Service: Urology;  Laterality: Right;  ? EXTRACORPOREAL SHOCK WAVE LITHOTRIPSY Left 03/13/2018  ? Procedure: LEFT EXTRACORPOREAL SHOCK WAVE LITHOTRIPSY (ESWL);  Surgeon: Ceasar Mons, MD;  Location: [facility];  Service: Urology;  Laterality: Left;  ? EYE SURGERY Right 06/11/2020  ? Cat Sx - Dr. Jola Schmidt  ? LAPAROSCOPIC LIVER CYST REMOVAL N/A 09/27/2017  ? Procedure: LAPAROSCOPIC LIVER CYST UNROOFING;  Surgeon: [name], MD;  Location: [facility];  Service: General;  Laterality: N/A;  ? POLYPECTOMY  06/12/2018  ? Procedure: POLYPECTOMY;  Surgeon: Laurence Spates, MD;  Location: [facility];  Service: Endoscopy;;  ? WISDOM TOOTH EXTRACTION    ? ?FAMILY HISTORY ?Family History  ?Problem Relation Age of Onset  ? Hypertension Mother   ? Hypertension Father   ? Hypertension Brother   ? Breast cancer Paternal Aunt   ? Diabetes Maternal Grandmother   ? ?SOCIAL HISTORY ?Social History  ? ?Tobacco Use  ? Smoking status: Never  ? Smokeless tobacco: Never  ?Vaping Use  ? Vaping Use: Never used  ?Substance Use Topics  ? Alcohol use: No  ? Drug use: No  ?  ? ?  ?OPHTHALMIC EXAM: ? ?Base Eye Exam   ? ? Visual Acuity (Snellen - Linear)   ? ?   Right Left  ? Dist cc 20/20 20/20 -1  ? ? Correction: Glasses  ? ?  ?  ? ? Tonometry (Tonopen, 1:07 PM)   ? ?   Right Left  ? Pressure 12 8  ? ?  ?  ? ? Pupils   ? ?   Pupils Dark Light Shape React APD  ? Right PERRL 4 3 Round Brisk None  ? Left PERRL 4 3 Round Brisk None  ? ?  ?  ? ? Visual Fields   ? ?   Left Right  ?  Full Full  ? ?  ?  ? ? Extraocular Movement    ? ?   Right Left  ?  Full, Ortho Full, Ortho  ? ?  ?  ? ? Neuro/Psych   ? ? Oriented x3: Yes  ? Mood/Affect: Normal  ? ?  ?  ? ? Dilation   ? ? Both eyes: 2.5% Phenylephrine, 1.0% Mydriacyl @ 1:05 PM  ? ?  ?  ? ?  ? ?Slit Lamp and Fundus Exam   ? ? Slit Lamp Exam   ? ?   Right Left  ? Lids/Lashes  Dermatochalasis - upper lid Dermatochalasis - upper lid  ? Conjunctiva/Sclera Nasal Pinguecula, mild Melanosis Nasal Pinguecula, mild Melanosis  ? Cornea Arcus, well healed cataract wound superiorly, early band K nasally, trace PEE Arcus, trace Punctate epithelial erosions, mild tear film debris  ? Anterior Chamber Deep and quiet deep, clear, narrow temporal angle  ? Iris Round and dilated Round and reactive  ? Lens PC IOL in good position, trace Posterior capsular opacification 2-3+ Nuclear sclerosis with brunescence, 2-3+ Cortical cataract  ? Anterior Vitreous Vitreous syneresis, Posterior vitreous detachment, vitreous condensations Vitreous syneresis  ? ?  ?  ? ? Fundus Exam   ? ?   Right Left  ? Disc mild Pallor, Sharp rim, mild PPP Pink and Sharp  ? C/D Ratio 0.6 0.4  ? Macula Flat, good foveal reflex, mild epiretinal membrane with striae, no heme Flat, good foveal reflex, focal Epiretinal membrane with striae superior to fovea, no heme, RPE mottling  ? Vessels attenuated, mild copper wiring, mild tortuosity mild attenuation, mild copper wiring, mild tortuosity  ? Periphery Attached, pigmented inferior cystoid degeneration, No RT/RD, No heme Attached, pigmented inferior cystoid degeneration, No heme, No RT/RD  ? ?  ?  ? ?  ? ?Refraction   ? ? Wearing Rx   ? ?   Sphere Cylinder Axis Add  ? Right -0.25 +0.25 150 +2.25  ? Left -0.50 +0.75 165 +2.25  ? ? Type: PAL  ? ?  ?  ? ?  ? ?IMAGING AND PROCEDURES  ?Imaging and Procedures for '@TODAY'$ @ ? ?OCT, Retina - OU - Both Eyes   ? ?   ?Right Eye ?Quality was good. Central Foveal Thickness: 385. Progression has improved. Findings include abnormal foveal contour, no IRF, no SRF,  epiretinal membrane, macular pucker (Interval improvement in central ERM and foveal profile).  ? ?Left Eye ?Quality was good. Central Foveal Thickness: 248. Progression has been stable. Findings include no

## 2021-09-09 ENCOUNTER — Ambulatory Visit (INDEPENDENT_AMBULATORY_CARE_PROVIDER_SITE_OTHER): Payer: 59 | Admitting: Ophthalmology

## 2021-09-09 ENCOUNTER — Encounter (INDEPENDENT_AMBULATORY_CARE_PROVIDER_SITE_OTHER): Payer: Self-pay | Admitting: Ophthalmology

## 2021-09-09 DIAGNOSIS — H25812 Combined forms of age-related cataract, left eye: Secondary | ICD-10-CM | POA: Diagnosis not present

## 2021-09-09 DIAGNOSIS — H35373 Puckering of macula, bilateral: Secondary | ICD-10-CM | POA: Diagnosis not present

## 2021-09-09 DIAGNOSIS — Z961 Presence of intraocular lens: Secondary | ICD-10-CM | POA: Diagnosis not present

## 2021-09-09 DIAGNOSIS — H25813 Combined forms of age-related cataract, bilateral: Secondary | ICD-10-CM

## 2021-09-15 ENCOUNTER — Encounter (INDEPENDENT_AMBULATORY_CARE_PROVIDER_SITE_OTHER): Payer: Self-pay | Admitting: Ophthalmology

## 2021-09-17 DIAGNOSIS — K1120 Sialoadenitis, unspecified: Secondary | ICD-10-CM | POA: Diagnosis not present

## 2021-10-06 DIAGNOSIS — D224 Melanocytic nevi of scalp and neck: Secondary | ICD-10-CM | POA: Diagnosis not present

## 2021-10-06 DIAGNOSIS — L821 Other seborrheic keratosis: Secondary | ICD-10-CM | POA: Diagnosis not present

## 2021-10-06 DIAGNOSIS — D235 Other benign neoplasm of skin of trunk: Secondary | ICD-10-CM | POA: Diagnosis not present

## 2021-10-06 DIAGNOSIS — D225 Melanocytic nevi of trunk: Secondary | ICD-10-CM | POA: Diagnosis not present

## 2021-10-06 DIAGNOSIS — D2262 Melanocytic nevi of left upper limb, including shoulder: Secondary | ICD-10-CM | POA: Diagnosis not present

## 2021-10-06 DIAGNOSIS — L819 Disorder of pigmentation, unspecified: Secondary | ICD-10-CM | POA: Diagnosis not present

## 2021-10-06 DIAGNOSIS — D2272 Melanocytic nevi of left lower limb, including hip: Secondary | ICD-10-CM | POA: Diagnosis not present

## 2021-10-06 DIAGNOSIS — D2261 Melanocytic nevi of right upper limb, including shoulder: Secondary | ICD-10-CM | POA: Diagnosis not present

## 2021-10-06 DIAGNOSIS — L720 Epidermal cyst: Secondary | ICD-10-CM | POA: Diagnosis not present

## 2021-10-14 DIAGNOSIS — Z13220 Encounter for screening for lipoid disorders: Secondary | ICD-10-CM | POA: Diagnosis not present

## 2021-10-14 DIAGNOSIS — Z79899 Other long term (current) drug therapy: Secondary | ICD-10-CM | POA: Diagnosis not present

## 2021-10-21 DIAGNOSIS — Z23 Encounter for immunization: Secondary | ICD-10-CM | POA: Diagnosis not present

## 2021-12-24 DIAGNOSIS — E663 Overweight: Secondary | ICD-10-CM | POA: Diagnosis not present

## 2021-12-24 DIAGNOSIS — R768 Other specified abnormal immunological findings in serum: Secondary | ICD-10-CM | POA: Diagnosis not present

## 2021-12-24 DIAGNOSIS — Z6826 Body mass index (BMI) 26.0-26.9, adult: Secondary | ICD-10-CM | POA: Diagnosis not present

## 2021-12-24 DIAGNOSIS — R682 Dry mouth, unspecified: Secondary | ICD-10-CM | POA: Diagnosis not present

## 2021-12-24 DIAGNOSIS — H04123 Dry eye syndrome of bilateral lacrimal glands: Secondary | ICD-10-CM | POA: Diagnosis not present

## 2021-12-31 DIAGNOSIS — H6121 Impacted cerumen, right ear: Secondary | ICD-10-CM | POA: Diagnosis not present

## 2021-12-31 DIAGNOSIS — R609 Edema, unspecified: Secondary | ICD-10-CM | POA: Diagnosis not present

## 2022-01-14 DIAGNOSIS — R768 Other specified abnormal immunological findings in serum: Secondary | ICD-10-CM | POA: Diagnosis not present

## 2022-01-14 DIAGNOSIS — R682 Dry mouth, unspecified: Secondary | ICD-10-CM | POA: Diagnosis not present

## 2022-01-14 DIAGNOSIS — Z6826 Body mass index (BMI) 26.0-26.9, adult: Secondary | ICD-10-CM | POA: Diagnosis not present

## 2022-01-14 DIAGNOSIS — E663 Overweight: Secondary | ICD-10-CM | POA: Diagnosis not present

## 2022-01-14 DIAGNOSIS — H04123 Dry eye syndrome of bilateral lacrimal glands: Secondary | ICD-10-CM | POA: Diagnosis not present

## 2022-02-01 DIAGNOSIS — R922 Inconclusive mammogram: Secondary | ICD-10-CM | POA: Diagnosis not present

## 2022-02-01 DIAGNOSIS — R1011 Right upper quadrant pain: Secondary | ICD-10-CM | POA: Diagnosis not present

## 2022-02-01 DIAGNOSIS — R768 Other specified abnormal immunological findings in serum: Secondary | ICD-10-CM | POA: Diagnosis not present

## 2022-02-12 ENCOUNTER — Other Ambulatory Visit: Payer: Self-pay | Admitting: General Surgery

## 2022-02-12 DIAGNOSIS — R1011 Right upper quadrant pain: Secondary | ICD-10-CM

## 2022-02-18 ENCOUNTER — Ambulatory Visit
Admission: RE | Admit: 2022-02-18 | Discharge: 2022-02-18 | Disposition: A | Discharge status: Home / Self Care | Payer: 59 | Source: Ambulatory Visit | Attending: General Surgery | Admitting: General Surgery

## 2022-02-18 DIAGNOSIS — R1011 Right upper quadrant pain: Secondary | ICD-10-CM | POA: Diagnosis not present

## 2022-02-18 DIAGNOSIS — M479 Spondylosis, unspecified: Secondary | ICD-10-CM | POA: Diagnosis not present

## 2022-02-18 DIAGNOSIS — Z9049 Acquired absence of other specified parts of digestive tract: Secondary | ICD-10-CM | POA: Diagnosis not present

## 2022-02-18 MED ORDER — IOPAMIDOL (ISOVUE-300) INJECTION 61%
100.0000 mL | Freq: Once | INTRAVENOUS | Status: AC | PRN
  Administered 2022-02-18: 100 mL via INTRAVENOUS

## 2022-03-08 DIAGNOSIS — R1011 Right upper quadrant pain: Secondary | ICD-10-CM | POA: Diagnosis not present

## 2022-03-08 DIAGNOSIS — N644 Mastodynia: Secondary | ICD-10-CM | POA: Diagnosis not present

## 2022-03-08 DIAGNOSIS — R922 Inconclusive mammogram: Secondary | ICD-10-CM | POA: Diagnosis not present

## 2022-03-08 DIAGNOSIS — R9230 Dense breasts, unspecified: Secondary | ICD-10-CM | POA: Diagnosis not present

## 2022-04-06 DIAGNOSIS — M791 Myalgia, unspecified site: Secondary | ICD-10-CM | POA: Diagnosis not present

## 2022-04-06 DIAGNOSIS — I7300 Raynaud's syndrome without gangrene: Secondary | ICD-10-CM | POA: Diagnosis not present

## 2022-04-06 DIAGNOSIS — H04123 Dry eye syndrome of bilateral lacrimal glands: Secondary | ICD-10-CM | POA: Diagnosis not present

## 2022-04-06 DIAGNOSIS — R0600 Dyspnea, unspecified: Secondary | ICD-10-CM | POA: Diagnosis not present

## 2022-04-06 DIAGNOSIS — M359 Systemic involvement of connective tissue, unspecified: Secondary | ICD-10-CM | POA: Diagnosis not present

## 2022-04-06 DIAGNOSIS — R682 Dry mouth, unspecified: Secondary | ICD-10-CM | POA: Diagnosis not present

## 2022-04-26 DIAGNOSIS — R609 Edema, unspecified: Secondary | ICD-10-CM | POA: Diagnosis not present

## 2022-04-29 ENCOUNTER — Telehealth: Payer: Self-pay | Admitting: Pulmonary Disease

## 2022-04-29 NOTE — Telephone Encounter (Signed)
Will forward back to the front desk for follow up.

## 2022-04-29 NOTE — Telephone Encounter (Signed)
Pt was to have had a referral sent from [facility] (Dr. August Albino SP?) for a PFT. This was 3 weeks ago. I did not see it. PT will ask them to resend but please check and req if none found. Appt for next week. TNX

## 2022-04-29 NOTE — Telephone Encounter (Signed)
PT will have to call to have referral resent. We can not generate and did not see it on file.

## 2022-05-01 ENCOUNTER — Emergency Department (HOSPITAL_COMMUNITY): Payer: 59

## 2022-05-01 ENCOUNTER — Emergency Department (HOSPITAL_COMMUNITY)
Admission: EM | Admit: 2022-05-01 | Discharge: 2022-05-02 | Disposition: A | Discharge status: Home / Self Care | Payer: 59 | Attending: Emergency Medicine | Admitting: Emergency Medicine

## 2022-05-01 ENCOUNTER — Other Ambulatory Visit: Payer: Self-pay

## 2022-05-01 DIAGNOSIS — Z041 Encounter for examination and observation following transport accident: Secondary | ICD-10-CM | POA: Diagnosis not present

## 2022-05-01 DIAGNOSIS — M47812 Spondylosis without myelopathy or radiculopathy, cervical region: Secondary | ICD-10-CM | POA: Diagnosis not present

## 2022-05-01 DIAGNOSIS — M25561 Pain in right knee: Secondary | ICD-10-CM | POA: Diagnosis not present

## 2022-05-01 DIAGNOSIS — M2578 Osteophyte, vertebrae: Secondary | ICD-10-CM | POA: Diagnosis not present

## 2022-05-01 DIAGNOSIS — S199XXA Unspecified injury of neck, initial encounter: Secondary | ICD-10-CM | POA: Diagnosis not present

## 2022-05-01 DIAGNOSIS — M542 Cervicalgia: Secondary | ICD-10-CM | POA: Diagnosis not present

## 2022-05-01 DIAGNOSIS — M25569 Pain in unspecified knee: Secondary | ICD-10-CM | POA: Diagnosis not present
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
## 2022-05-03 ENCOUNTER — Other Ambulatory Visit: Payer: Self-pay | Admitting: *Deleted

## 2022-05-03 ENCOUNTER — Ambulatory Visit (INDEPENDENT_AMBULATORY_CARE_PROVIDER_SITE_OTHER): Payer: 59 | Admitting: Pulmonary Disease

## 2022-05-03 DIAGNOSIS — R0689 Other abnormalities of breathing: Secondary | ICD-10-CM

## 2022-05-03 DIAGNOSIS — R0600 Dyspnea, unspecified: Secondary | ICD-10-CM

## 2022-05-03 LAB — PULMONARY FUNCTION TEST
DL/VA % pred: 97 %
DL/VA: 4.06 ml/min/mmHg/L
DLCO cor % pred: 97 %
DLCO cor: 20.03 ml/min/mmHg
DLCO unc % pred: 97 %
DLCO unc: 20.03 ml/min/mmHg
FEF 25-75 Pre: 3.27 L/sec
FEF2575-%Pred-Pre: 146 %
FEV1-%Pred-Pre: 103 %
FEV1-Pre: 2.64 L
FEV1FVC-%Pred-Pre: 109 %
FEV6-%Pred-Pre: 97 %
FEV6-Pre: 3.12 L
FEV6FVC-%Pred-Pre: 103 %
FVC-%Pred-Pre: 93 %
FVC-Pre: 3.12 L
Pre FEV1/FVC ratio: 85 %
Pre FEV6/FVC Ratio: 100 %
RV % pred: 34 %
RV: 0.74 L
TLC % pred: 79 %
TLC: 4.13 L

## 2022-05-03 NOTE — Progress Notes (Signed)
Patient referred by Dr. Carlene Coria and did not have paper referral or order. Provider office was contacted x 2 without success. Patient scheduled for b&a.Marland Kitchen Spirometry/dlco and lung volumes completed today.

## 2022-05-03 NOTE — Patient Instructions (Signed)
Spirometry/dlco and lung volumes completed today.

## 2022-05-03 NOTE — Telephone Encounter (Signed)
If the referral has not been sent from that office, pt will need to contact them to have it sent over again. Please always schedule pts for consults if they are contacting us.If referral is received prior to appt, we will input and attach. Nothing further needed.

## 2022-05-05 DIAGNOSIS — M542 Cervicalgia: Secondary | ICD-10-CM | POA: Diagnosis not present

## 2022-05-05 DIAGNOSIS — Z87828 Personal history of other (healed) physical injury and trauma: Secondary | ICD-10-CM | POA: Diagnosis not present

## 2022-05-14 ENCOUNTER — Other Ambulatory Visit (HOSPITAL_COMMUNITY): Payer: Self-pay

## 2022-05-14 ENCOUNTER — Other Ambulatory Visit (HOSPITAL_COMMUNITY): Payer: Self-pay | Admitting: Rheumatology

## 2022-05-14 ENCOUNTER — Ambulatory Visit (HOSPITAL_COMMUNITY)
Admission: RE | Admit: 2022-05-14 | Discharge: 2022-05-14 | Disposition: A | Discharge status: Home / Self Care | Payer: 59 | Source: Ambulatory Visit | Attending: Rheumatology | Admitting: Rheumatology

## 2022-05-14 DIAGNOSIS — R0609 Other forms of dyspnea: Secondary | ICD-10-CM | POA: Insufficient documentation

## 2022-05-14 DIAGNOSIS — R0600 Dyspnea, unspecified: Secondary | ICD-10-CM

## 2022-05-14 LAB — ECHOCARDIOGRAM COMPLETE
Area-P 1/2: 3.08 cm2
S' Lateral: 2.7 cm

## 2022-06-02 ENCOUNTER — Ambulatory Visit (HOSPITAL_COMMUNITY)
Admission: RE | Admit: 2022-06-02 | Discharge: 2022-06-02 | Disposition: A | Discharge status: Home / Self Care | Payer: Medicare Other | Source: Ambulatory Visit | Attending: Cardiology | Admitting: Cardiology

## 2022-06-02 ENCOUNTER — Other Ambulatory Visit (HOSPITAL_COMMUNITY): Payer: Self-pay

## 2022-06-02 ENCOUNTER — Encounter (HOSPITAL_COMMUNITY): Payer: Self-pay | Admitting: Cardiology

## 2022-06-02 ENCOUNTER — Encounter (HOSPITAL_COMMUNITY): Payer: 59

## 2022-06-02 VITALS — BP 110/70 | HR 92 | Wt 145.6 lb

## 2022-06-02 DIAGNOSIS — R0600 Dyspnea, unspecified: Secondary | ICD-10-CM

## 2022-06-02 DIAGNOSIS — I7300 Raynaud's syndrome without gangrene: Secondary | ICD-10-CM | POA: Insufficient documentation

## 2022-06-02 DIAGNOSIS — R0602 Shortness of breath: Secondary | ICD-10-CM | POA: Insufficient documentation

## 2022-06-02 LAB — BRAIN NATRIURETIC PEPTIDE: B Natriuretic Peptide: 17.1 pg/mL (ref 0.0–100.0)

## 2022-06-02 NOTE — Progress Notes (Signed)
PCP: Charlane Ferretti, MD Rheumatologist: Dr. Carlene Coria HF Cardiology: Dr. Aundra Dubin  67 y.o. 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  Patient has history of dry eyes/mouth and Raynaud's phenomenon.  She was referred to a rheumatologist who thinks that she may have MCTD.  She has no history of cardiac problems. Since the summer of 2023, she has noted exertional dyspnea.  This has been slowly progressive.  She is short of breath walking around her house (mildly) now.  Her heart rate rises rapidly with exertion.  She gets short of breath when she goes on walks for exercise.  She rarely has chest tightness, this will occasionally with occur with anxiety but never with exertion. She says she got winded walking into the office today.  No orthopnea/PND.  No palpitations.  No lightheadedness or syncope.   Echo was done, showing EF 60-65%, normal RV, normal IVC, TR inadequate to assess PA pressure.  PFTs were done and were noted to be normal including DLCO.   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  2. Hyperlipidemia.  3. GERD: Hiatal hernia.  4. Dyspnea: Echo (12/23) with EF 60-65%, normal RV, normal IVC, TR inadequate to assess PA pressure.   - PFTs (12/23): normal including DLCO.   Social History   Socioeconomic History   Marital status: Single    Spouse name: Not on file   Number of children: Not on file   Years of education: Not on file   Highest education level: Not on file  Occupational History   Not on file  Tobacco Use   Smoking status: Never   Smokeless tobacco: Never  Vaping Use   Vaping Use: Never used  Substance and Sexual Activity   Alcohol use: No   Drug use: No   Sexual activity: Not on file  Other Topics Concern   Not on file  Social History Narrative   Not on file   Social Determinants of Health    Financial Resource Strain: Not on file  Food Insecurity: Not on file  Transportation Needs: Not on file  Physical Activity: Not on file  Stress: Not on file  Social Connections: Not on file  Intimate Partner Violence: Not on file   Family History  Problem Relation Age of Onset   Hypertension Mother    Hypertension Father    Hypertension Brother    Breast cancer Paternal Aunt    Diabetes Maternal Grandmother    ROS: All systems reviewed and negative except as per HPI.   Current Outpatient Medications  Medication Sig Dispense Refill   atorvastatin (LIPITOR) 10 MG tablet Take 10 mg by mouth daily.     Calcium Carbonate-Vitamin D (CALCIUM-D PO) Take 1 tablet by mouth daily.      cyanocobalamin (VITAMIN B12) 1000 MCG tablet Take 1,000 mcg by mouth as needed.     cyclobenzaprine (FLEXERIL) 5 MG tablet Take 5 mg by mouth 2 (two) times daily as needed.     estradiol (CLIMARA - DOSED IN MG/24 HR) 0.0375 mg/24hr patch 0.0375 mg once a week.     Multiple Vitamin (MULTIVITAMIN WITH MINERALS) TABS tablet Take 1 tablet by mouth daily.      naproxen sodium (ALEVE) 220 MG tablet 1 tablet as needed     omeprazole (PRILOSEC) 20 MG capsule Take 20 mg by mouth daily as needed (acid reflux).  pseudoephedrine (SUDAFED) 30 MG tablet 1 tablet as needed     No current facility-administered medications for this encounter.   BP 110/70   Pulse 92   Wt 66 kg (145 lb 9.6 oz)   SpO2 100%   BMI 25.79 kg/m  General: NAD Neck: No JVD, no thyromegaly or thyroid nodule.  Lungs: Clear to auscultation bilaterally with normal respiratory effort. CV: Nondisplaced PMI.  Heart regular S1/S2, no S3/S4, no murmur.  No peripheral edema.  No carotid bruit.  Normal pedal pulses.  Abdomen: Soft, nontender, no hepatosplenomegaly, no distention.  Skin: Intact without lesions or rashes.  Neurologic: Alert and oriented x 3.  Psych: Normal affect. Extremities: No clubbing or cyanosis.  HEENT: Normal.    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  2. Exertional dyspnea: Slowly progressive.  PFTs were normal including DLCO in 12/23.  Echo in 12/23 showed EF 60-65%, normal RV, normal IVC, TR inadequate to assess PA pressure.  ECG normal.  With mixed connective tissue disorder, interstitial lung disease and pulmonary hypertension are considerations.  PFTs show no evidence for ILD.  Echo showed normal LV and RV function, but unable to estimate PA pressure. She is not volume overloaded on exam.  - Check BNP.  - Given exertional symptoms and MCTD, I think it would be reasonable to do RHC to assess PA pressure. We discussed risks/benefits and she agrees to procedure.   Followup will depend on RHC.   [name] 06/02/2022

## 2022-06-02 NOTE — Patient Instructions (Signed)
There has been no changes to your medications.  Labs done today, your results will be available in MyChart, we will contact you for abnormal readings.  You are scheduled for a Cardiac Catheterization on Tuesday, January 30 with Dr. [name].  1. Please arrive at the [facility] (Main Entrance A) at [facility]: [address] [place], [place] [address] at 5:30 AM (This time is two hours before your procedure to ensure your preparation). Free valet parking service is available.   Special note: Every effort is made to have your procedure done on time. Please understand that emergencies sometimes delay scheduled procedures.  2. Diet: Do not eat solid foods after midnight.  The patient may have clear liquids until 5am upon the day of the procedure.  3. Medication instructions in preparation for your procedure:   Contrast Allergy: No   On the morning of your procedure, take your  morning medicines NOT listed above.  You may use sips of water.  5. Plan for one night stay--bring personal belongings. 6. Bring a current list of your medications and current insurance cards. 7. You MUST have a responsible person to drive you home. 8. Someone MUST be with you the first 24 hours after you arrive home or your discharge will be delayed. 9. Please wear clothes that are easy to get on and off and wear slip-on shoes.  Your physician recommends that you schedule a follow-up appointment will depend on the results of your Cath. If you have any questions or concerns before your next appointment please send [place] a message through [place] or call our office at [phone].    TO LEAVE A MESSAGE FOR THE NURSE SELECT OPTION 2, PLEASE LEAVE A MESSAGE INCLUDING: YOUR NAME DATE OF BIRTH CALL BACK NUMBER REASON FOR CALL**this is important as we prioritize the call backs  YOU WILL RECEIVE A CALL BACK THE SAME DAY AS LONG AS YOU CALL BEFORE 4:00 PM  At the [facility], you and your  health needs are our priority. As part of our continuing mission to provide you with exceptional heart care, we have created designated Provider Care Teams. These Care Teams include your primary Cardiologist (physician) and Advanced Practice Providers (APPs- Physician Assistants and Nurse Practitioners) who all work together to provide you with the care you need, when you need it.   You may see any of the following providers on your designated Care Team at your next follow up: Dr Glori Bickers Dr [name] Dr. Roxana Hires, NP Lyda Jester, [place] [facility] Panther Burn, [place] Forestine Na, NP Audry Riles, PharmD   Please be sure to bring in all your medications bottles to every appointment.

## 2022-06-02 NOTE — H&P (View-Only) (Signed)
PCP: Charlane Ferretti, MD Rheumatologist: Dr. Carlene Coria HF Cardiology: Dr. Aundra Dubin  67 y.o. 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  Patient has history of dry eyes/mouth and Raynaud's phenomenon.  She was referred to a rheumatologist who thinks that she may have MCTD.  She has no history of cardiac problems. Since the summer of 2023, she has noted exertional dyspnea.  This has been slowly progressive.  She is short of breath walking around her house (mildly) now.  Her heart rate rises rapidly with exertion.  She gets short of breath when she goes on walks for exercise.  She rarely has chest tightness, this will occasionally with occur with anxiety but never with exertion. She says she got winded walking into the office today.  No orthopnea/PND.  No palpitations.  No lightheadedness or syncope.   Echo was done, showing EF 60-65%, normal RV, normal IVC, TR inadequate to assess PA pressure.  PFTs were done and were noted to be normal including DLCO.   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  2. Hyperlipidemia.  3. GERD: Hiatal hernia.  4. Dyspnea: Echo (12/23) with EF 60-65%, normal RV, normal IVC, TR inadequate to assess PA pressure.   - PFTs (12/23): normal including DLCO.   Social History   Socioeconomic History   Marital status: Single    Spouse name: Not on file   Number of children: Not on file   Years of education: Not on file   Highest education level: Not on file  Occupational History   Not on file  Tobacco Use   Smoking status: Never   Smokeless tobacco: Never  Vaping Use   Vaping Use: Never used  Substance and Sexual Activity   Alcohol use: No   Drug use: No   Sexual activity: Not on file  Other Topics Concern   Not on file  Social History Narrative   Not on file   Social Determinants of Health    Financial Resource Strain: Not on file  Food Insecurity: Not on file  Transportation Needs: Not on file  Physical Activity: Not on file  Stress: Not on file  Social Connections: Not on file  Intimate Partner Violence: Not on file   Family History  Problem Relation Age of Onset   Hypertension Mother    Hypertension Father    Hypertension Brother    Breast cancer Paternal Aunt    Diabetes Maternal Grandmother    ROS: All systems reviewed and negative except as per HPI.   Current Outpatient Medications  Medication Sig Dispense Refill   atorvastatin (LIPITOR) 10 MG tablet Take 10 mg by mouth daily.     Calcium Carbonate-Vitamin D (CALCIUM-D PO) Take 1 tablet by mouth daily.      cyanocobalamin (VITAMIN B12) 1000 MCG tablet Take 1,000 mcg by mouth as needed.     cyclobenzaprine (FLEXERIL) 5 MG tablet Take 5 mg by mouth 2 (two) times daily as needed.     estradiol (CLIMARA - DOSED IN MG/24 HR) 0.0375 mg/24hr patch 0.0375 mg once a week.     Multiple Vitamin (MULTIVITAMIN WITH MINERALS) TABS tablet Take 1 tablet by mouth daily.      naproxen sodium (ALEVE) 220 MG tablet 1 tablet as needed     omeprazole (PRILOSEC) 20 MG capsule Take 20 mg by mouth daily as needed (acid reflux).  pseudoephedrine (SUDAFED) 30 MG tablet 1 tablet as needed     No current facility-administered medications for this encounter.   BP 110/70   Pulse 92   Wt 66 kg (145 lb 9.6 oz)   SpO2 100%   BMI 25.79 kg/m  General: NAD Neck: No JVD, no thyromegaly or thyroid nodule.  Lungs: Clear to auscultation bilaterally with normal respiratory effort. CV: Nondisplaced PMI.  Heart regular S1/S2, no S3/S4, no murmur.  No peripheral edema.  No carotid bruit.  Normal pedal pulses.  Abdomen: Soft, nontender, no hepatosplenomegaly, no distention.  Skin: Intact without lesions or rashes.  Neurologic: Alert and oriented x 3.  Psych: Normal affect. Extremities: No clubbing or cyanosis.  HEENT: Normal.    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  2. Exertional dyspnea: Slowly progressive.  PFTs were normal including DLCO in 12/23.  Echo in 12/23 showed EF 60-65%, normal RV, normal IVC, TR inadequate to assess PA pressure.  ECG normal.  With mixed connective tissue disorder, interstitial lung disease and pulmonary hypertension are considerations.  PFTs show no evidence for ILD.  Echo showed normal LV and RV function, but unable to estimate PA pressure. She is not volume overloaded on exam.  - Check BNP.  - Given exertional symptoms and MCTD, I think it would be reasonable to do RHC to assess PA pressure. We discussed risks/benefits and she agrees to procedure.   Followup will depend on RHC.   [name] 06/02/2022

## 2022-06-14 ENCOUNTER — Telehealth (HOSPITAL_COMMUNITY): Payer: Self-pay

## 2022-06-14 NOTE — Telephone Encounter (Signed)
Spoke to patient reminding her of catheterization scheduled for tomorrow. Aware of time, nothing to eat or drink after midnight. Has transportation to and from procedure.

## 2022-06-15 ENCOUNTER — Encounter (HOSPITAL_COMMUNITY): Payer: Self-pay | Admitting: Cardiology

## 2022-06-15 ENCOUNTER — Ambulatory Visit (HOSPITAL_COMMUNITY)
Admission: RE | Admit: 2022-06-15 | Discharge: 2022-06-15 | Disposition: A | Discharge status: Home / Self Care | Payer: Medicare Other | Attending: Cardiology | Admitting: Cardiology

## 2022-06-15 ENCOUNTER — Encounter (HOSPITAL_COMMUNITY)
Admission: RE | Disposition: A | Discharge status: Home / Self Care | Payer: Self-pay | Source: Home / Self Care | Attending: Cardiology

## 2022-06-15 DIAGNOSIS — R0609 Other forms of dyspnea: Secondary | ICD-10-CM | POA: Insufficient documentation

## 2022-06-15 DIAGNOSIS — I272 Pulmonary hypertension, unspecified: Secondary | ICD-10-CM

## 2022-06-15 DIAGNOSIS — R0600 Dyspnea, unspecified: Secondary | ICD-10-CM

## 2022-06-15 DIAGNOSIS — I509 Heart failure, unspecified: Secondary | ICD-10-CM

## 2022-06-15 DIAGNOSIS — I7300 Raynaud's syndrome without gangrene: Secondary | ICD-10-CM | POA: Insufficient documentation

## 2022-06-15 HISTORY — PX: RIGHT HEART CATH: CATH118263

## 2022-06-15 LAB — POCT I-STAT EG7
Acid-Base Excess: 0 mmol/L (ref 0.0–2.0)
Acid-base deficit: 1 mmol/L (ref 0.0–2.0)
Bicarbonate: 24.5 mmol/L (ref 20.0–28.0)
Bicarbonate: 25.5 mmol/L (ref 20.0–28.0)
Calcium, Ion: 1.25 mmol/L (ref 1.15–1.40)
Calcium, Ion: 1.29 mmol/L (ref 1.15–1.40)
HCT: 34 % — ABNORMAL LOW (ref 36.0–46.0)
HCT: 35 % — ABNORMAL LOW (ref 36.0–46.0)
Hemoglobin: 11.6 g/dL — ABNORMAL LOW (ref 12.0–15.0)
Hemoglobin: 11.9 g/dL — ABNORMAL LOW (ref 12.0–15.0)
O2 Saturation: 79 %
O2 Saturation: 76 %
Potassium: 4.6 mmol/L (ref 3.5–5.1)
Potassium: 4.7 mmol/L (ref 3.5–5.1)
Sodium: 140 mmol/L (ref 135–145)
Sodium: 139 mmol/L (ref 135–145)
TCO2: 26 mmol/L (ref 22–32)
TCO2: 27 mmol/L (ref 22–32)
pCO2, Ven: 41.3 mmHg — ABNORMAL LOW (ref 44–60)
pCO2, Ven: 44.3 mmHg (ref 44–60)
pH, Ven: 7.381 (ref 7.25–7.43)
pH, Ven: 7.369 (ref 7.25–7.43)
pO2, Ven: 44 mmHg (ref 32–45)
pO2, Ven: 42 mmHg (ref 32–45)

## 2022-06-15 LAB — BASIC METABOLIC PANEL
Anion gap: 8 (ref 5–15)
BUN: 11 mg/dL (ref 8–23)
CO2: 26 mmol/L (ref 22–32)
Calcium: 9.3 mg/dL (ref 8.9–10.3)
Chloride: 102 mmol/L (ref 98–111)
Creatinine, Ser: 1.08 mg/dL — ABNORMAL HIGH (ref 0.44–1.00)
GFR, Estimated: 57 mL/min — ABNORMAL LOW (ref >60)
Glucose, Bld: 102 mg/dL — ABNORMAL HIGH (ref 70–99)
Potassium: 3.4 mmol/L — ABNORMAL LOW (ref 3.5–5.1)
Sodium: 136 mmol/L (ref 135–145)

## 2022-06-15 LAB — CBC
HCT: 37.1 % (ref 36.0–46.0)
Hemoglobin: 11.9 g/dL — ABNORMAL LOW (ref 12.0–15.0)
MCH: 27.2 pg (ref 26.0–34.0)
MCHC: 32.1 g/dL (ref 30.0–36.0)
MCV: 84.9 fL (ref 80.0–100.0)
Platelets: 227 10*3/uL (ref 150–400)
RBC: 4.37 MIL/uL (ref 3.87–5.11)
RDW: 14.2 % (ref 11.5–15.5)
WBC: 4.9 10*3/uL (ref 4.0–10.5)
nRBC: 0 % (ref 0.0–0.2)

## 2022-06-15 SURGERY — RIGHT HEART CATH
Anesthesia: LOCAL

## 2022-06-15 MED ORDER — HYDRALAZINE HCL 20 MG/ML IJ SOLN: 10.0000 mg | INTRAMUSCULAR | Status: DC | PRN

## 2022-06-15 MED ORDER — SODIUM CHLORIDE 0.9% FLUSH: 3.0000 mL | Freq: Two times a day (BID) | INTRAVENOUS | Status: DC

## 2022-06-15 MED ORDER — POTASSIUM CHLORIDE CRYS ER 20 MEQ PO TBCR: 40.0000 meq | EXTENDED_RELEASE_TABLET | Freq: Once | ORAL | Status: DC

## 2022-06-15 MED ORDER — ACETAMINOPHEN 325 MG PO TABS: 650.0000 mg | ORAL_TABLET | ORAL | Status: DC | PRN

## 2022-06-15 MED ORDER — HEPARIN (PORCINE) IN NACL 1000-0.9 UT/500ML-% IV SOLN
INTRAVENOUS | Status: DC | PRN
  Administered 2022-06-15: 500 mL

## 2022-06-15 MED ORDER — POTASSIUM CHLORIDE 20 MEQ PO PACK
40.0000 meq | PACK | ORAL | Status: AC
  Administered 2022-06-15: 40 meq via ORAL
  Filled 2022-06-15: qty 2

## 2022-06-15 MED ORDER — SODIUM CHLORIDE 0.9 % IV SOLN: 250.0000 mL | INTRAVENOUS | Status: DC | PRN

## 2022-06-15 MED ORDER — ASPIRIN 81 MG PO CHEW: 81.0000 mg | CHEWABLE_TABLET | ORAL | Status: DC

## 2022-06-15 MED ORDER — POTASSIUM CHLORIDE 20 MEQ PO PACK: 40.0000 meq | PACK | Freq: Two times a day (BID) | ORAL | Status: DC

## 2022-06-15 MED ORDER — HEPARIN (PORCINE) IN NACL 1000-0.9 UT/500ML-% IV SOLN
INTRAVENOUS | Status: AC
  Filled 2022-06-15: qty 1000

## 2022-06-15 MED ORDER — LIDOCAINE HCL (PF) 1 % IJ SOLN
INTRAMUSCULAR | Status: AC
  Filled 2022-06-15: qty 30

## 2022-06-15 MED ORDER — SODIUM CHLORIDE 0.9% FLUSH: 3.0000 mL | INTRAVENOUS | Status: DC | PRN

## 2022-06-15 MED ORDER — SODIUM CHLORIDE 0.9 % IV SOLN: INTRAVENOUS | Status: DC

## 2022-06-15 MED ORDER — ASPIRIN 81 MG PO CHEW
81.0000 mg | CHEWABLE_TABLET | ORAL | Status: AC
  Administered 2022-06-15: 81 mg via ORAL
  Filled 2022-06-15: qty 1

## 2022-06-15 MED ORDER — LIDOCAINE HCL (PF) 1 % IJ SOLN
INTRAMUSCULAR | Status: DC | PRN
  Administered 2022-06-15: 2 mL

## 2022-06-15 MED ORDER — POTASSIUM CHLORIDE CRYS ER 20 MEQ PO TBCR
EXTENDED_RELEASE_TABLET | ORAL | Status: AC
  Filled 2022-06-15: qty 2

## 2022-06-15 MED ORDER — ONDANSETRON HCL 4 MG/2ML IJ SOLN: 4.0000 mg | Freq: Four times a day (QID) | INTRAMUSCULAR | Status: DC | PRN

## 2022-06-15 MED ORDER — LABETALOL HCL 5 MG/ML IV SOLN: 10.0000 mg | INTRAVENOUS | Status: DC | PRN

## 2022-06-15 SURGICAL SUPPLY — 8 items
CATH BALLN WEDGE 5F 110CM (CATHETERS) IMPLANT
PACK CARDIAC CATHETERIZATION (CUSTOM PROCEDURE TRAY) ×1 IMPLANT
PROTECTION STATION PRESSURIZED (MISCELLANEOUS) ×1
SHEATH GLIDE SLENDER 4/5FR (SHEATH) IMPLANT
STATION PROTECTION PRESSURIZED (MISCELLANEOUS) IMPLANT
TRANSDUCER W/STOPCOCK (MISCELLANEOUS) ×1 IMPLANT
TUBING ART PRESS 72  MALE/FEM (TUBING) ×1
TUBING ART PRESS 72 MALE/FEM (TUBING) IMPLANT

## 2022-06-15 NOTE — Interval H&P Note (Signed)
History and Physical Interval Note:  06/15/2022 7:55 AM  [name]  has presented today for surgery, with the diagnosis of hp.  The various methods of treatment have been discussed with the patient and family. After consideration of risks, benefits and other options for treatment, the patient has consented to  Procedure(s): RIGHT HEART CATH (N/A) as a surgical intervention.  The patient's history has been reviewed, patient examined, no change in status, stable for surgery.  I have reviewed the patient's chart and labs.  Questions were answered to the patient's satisfaction.     Lillah Standre [employer]

## 2022-06-28 ENCOUNTER — Other Ambulatory Visit: Payer: Self-pay | Admitting: Internal Medicine

## 2022-06-28 DIAGNOSIS — Z1231 Encounter for screening mammogram for malignant neoplasm of breast: Secondary | ICD-10-CM

## 2022-07-07 DIAGNOSIS — R0600 Dyspnea, unspecified: Secondary | ICD-10-CM | POA: Diagnosis not present

## 2022-07-07 DIAGNOSIS — M359 Systemic involvement of connective tissue, unspecified: Secondary | ICD-10-CM | POA: Diagnosis not present

## 2022-07-07 DIAGNOSIS — I7300 Raynaud's syndrome without gangrene: Secondary | ICD-10-CM | POA: Diagnosis not present

## 2022-07-07 DIAGNOSIS — H04123 Dry eye syndrome of bilateral lacrimal glands: Secondary | ICD-10-CM | POA: Diagnosis not present

## 2022-08-11 ENCOUNTER — Ambulatory Visit
Admission: RE | Admit: 2022-08-11 | Discharge: 2022-08-11 | Disposition: A | Discharge status: Home / Self Care | Payer: Medicare Other | Source: Ambulatory Visit | Attending: Internal Medicine | Admitting: Internal Medicine

## 2022-08-11 DIAGNOSIS — Z1231 Encounter for screening mammogram for malignant neoplasm of breast: Secondary | ICD-10-CM

## 2022-08-31 NOTE — Progress Notes (Signed)
[facility] - Clinic Note  09/14/2022     CHIEF COMPLAINT Patient presents for Retina Evaluation    HISTORY OF PRESENT ILLNESS: [name] is a 67 y.o. female who presents to the clinic today for:   HPI     Retina Evaluation   In both eyes.  This started 1 year ago.  Duration of 1 year.  I, the attending physician,  performed the HPI with the patient and updated documentation appropriately.        Comments   1 year retina follow up ERM OU pt is reporting no vision changes noticed she denies floaters she does have some flashes at times she saw Dr Cathey Endow yesterday       Last edited by Rennis Chris, MD on 09/14/2022  8:39 PM.     Pt states she just saw Dr. Cathey Endow yesterday for her routine eye exam, pt states she has "some kind of connective tissue disease" she is seeing a rheumatologist to figure out a dx  Referring physician: Sinda Du, MD [address] [place],  [place] [address]  HISTORICAL INFORMATION:   Selected notes from the MEDICAL RECORD NUMBER Referred by Dr. Norm Parcel for concern of ERM OU LEE: 05.01.21 (L. Mazarella) Ocular Hx- PMH-   CURRENT MEDICATIONS: Current Outpatient Medications (Ophthalmic Drugs)  Medication Sig   Propylene Glycol (SYSTANE COMPLETE) 0.6 % SOLN Place 1 drop into both eyes 4 (four) times daily.   No current facility-administered medications for this visit. (Ophthalmic Drugs)   Current Outpatient Medications (Other)  Medication Sig   Acetaminophen (TYLENOL DISSOLVE PACKS) 500 MG PACK Take 500-1,000 mg by mouth every 6 (six) hours as needed (pain).   antiseptic oral rinse (BIOTENE) LIQD 15 mLs by Mouth Rinse route as needed for dry mouth.   atorvastatin (LIPITOR) 10 MG tablet Take 10 mg by mouth at bedtime.   bisacodyl 5 MG EC tablet Take 5-15 mg by mouth daily as needed for moderate constipation.   Calcium Carbonate-Vitamin D (CALCIUM-D PO) Take 2 tablets by mouth daily.   Cyanocobalamin (B-12) 5000  MCG CAPS Take 5,000 mcg by mouth daily.   estradiol (CLIMARA - DOSED IN MG/24 HR) 0.0375 mg/24hr patch Place 0.0375 mg onto the skin every Monday.   Multiple Vitamins-Minerals (ADULT GUMMY PO) Take 2 capsules by mouth daily.   naproxen sodium (ALEVE) 220 MG tablet Take 220 mg by mouth daily as needed (pain).   omeprazole (PRILOSEC) 20 MG capsule Take 20 mg by mouth daily as needed (acid reflux).   polyethylene glycol (MIRALAX / GLYCOLAX) 17 g packet Take 8.5-17 g by mouth daily as needed for moderate constipation.   Probiotic CHEW Chew 2 capsules by mouth daily.   pseudoephedrine (SUDAFED) 30 MG tablet Take 60 mg by mouth every 6 (six) hours as needed for congestion.   simethicone (MYLICON) 125 MG chewable tablet Chew 125 mg by mouth every 6 (six) hours as needed for flatulence.   sodium chloride (OCEAN) 0.65 % SOLN nasal spray Place 1 spray into both nostrils as needed for congestion.   vitamin E 180 MG (400 UNITS) capsule Take 400 Units by mouth 2 (two) times daily.   No current facility-administered medications for this visit. (Other)   REVIEW OF SYSTEMS: ROS   Positive for: Gastrointestinal, Eyes Negative for: Constitutional, Neurological, Skin, Genitourinary, Musculoskeletal, HENT, Endocrine, Cardiovascular, Respiratory, Psychiatric, Allergic/Imm, Heme/Lymph Last edited by Etheleen Mayhew, COT on 09/14/2022 12:52 PM.      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 Sinda Du   CHOLECYSTECTOMY     COLONOSCOPY WITH PROPOFOL N/A  06/12/2018   Procedure: COLONOSCOPY WITH PROPOFOL;  Surgeon: Carman Ching, MD;  Location: [facility];  Service: Endoscopy;  Laterality: N/A;   COLPOSCOPY     ESOPHAGOGASTRODUODENOSCOPY (EGD) WITH PROPOFOL N/A 06/12/2018   Procedure: ESOPHAGOGASTRODUODENOSCOPY (EGD) WITH PROPOFOL;  Surgeon: Carman Ching, MD;  Location: [facility];  Service: Endoscopy;  Laterality: N/A;   EXTRACORPOREAL SHOCK WAVE LITHOTRIPSY Right 10/31/2017   Procedure: RIGHT EXTRACORPOREAL SHOCK WAVE LITHOTRIPSY (ESWL);  Surgeon: Rene Paci, MD;  Location: [facility];  Service: Urology;  Laterality: Right;   EXTRACORPOREAL SHOCK WAVE LITHOTRIPSY Left 03/13/2018   Procedure: LEFT EXTRACORPOREAL SHOCK WAVE LITHOTRIPSY (ESWL);  Surgeon: Rene Paci, MD;  Location: [facility];  Service: Urology;  Laterality: Left;   EYE SURGERY Right 06/11/2020   Cat Sx - Dr. Sinda Du   LAPAROSCOPIC LIVER CYST REMOVAL N/A 09/27/2017   Procedure: LAPAROSCOPIC LIVER CYST UNROOFING;  Surgeon: Almond Lint, MD;  Location: [facility];  Service: General;  Laterality: N/A;   POLYPECTOMY  06/12/2018   Procedure: POLYPECTOMY;  Surgeon: Carman Ching, MD;  Location: [facility];  Service: Endoscopy;;   RIGHT HEART CATH N/A 06/15/2022   Procedure: RIGHT HEART CATH;  Surgeon: Laurey Morale, MD;  Location: [facility];  Service: Cardiovascular;  Laterality: N/A;   WISDOM TOOTH EXTRACTION     FAMILY HISTORY Family History  Problem Relation Age of Onset   Hypertension Mother    Hypertension Father    Hypertension Brother    Breast cancer Paternal Aunt    Diabetes Maternal Grandmother    SOCIAL HISTORY Social History   Tobacco Use   Smoking status: Never   Smokeless tobacco: Never  Vaping Use   Vaping Use: Never used  Substance Use Topics   Alcohol use: No   Drug use: No       OPHTHALMIC EXAM:  Base Eye Exam     Visual Acuity (Snellen - Linear)       Right Left   Dist cc 20/20 20/20 -2          Tonometry (Tonopen, 12:54 PM)       Right Left   Pressure 15 16         Pupils       Pupils Dark Light Shape React APD   Right PERRL 4 3 Round Brisk None   Left PERRL 4 3 Round Brisk None         Visual Fields       Left Right    Full Full         Extraocular Movement       Right Left    Full, Ortho Full, Ortho         Neuro/Psych     Oriented x3: Yes   Mood/Affect: Normal  Dilation     Both eyes: 2.5% Phenylephrine @ 12:55 PM           Slit Lamp and Fundus Exam     Slit Lamp Exam       Right Left   Lids/Lashes Dermatochalasis - upper lid Dermatochalasis - upper lid   Conjunctiva/Sclera Nasal Pinguecula, mild Melanosis Nasal Pinguecula, mild Melanosis   Cornea Arcus, well healed cataract wound superiorly, early band K nasally, trace PEE Arcus, trace Punctate epithelial erosions, mild tear film debris   Anterior Chamber Deep and quiet deep, clear, narrow temporal angle   Iris Round and dilated Round and reactive   Lens PC IOL in good position, trace Posterior capsular opacification 2-3+ Nuclear sclerosis with brunescence, 2-3+ Cortical cataract   Anterior Vitreous Vitreous syneresis, Posterior vitreous detachment, vitreous condensations Vitreous syneresis         Fundus Exam       Right Left   Disc mild Pallor, Sharp rim, mild PPP Pink and Sharp, mild PPP   C/D Ratio 0.6 0.4   Macula Flat, good foveal reflex, mild epiretinal membrane with striae, no heme Flat, good foveal reflex, focal Epiretinal membrane with striae superior to fovea, no heme, RPE mottling   Vessels mild attenuation, mild tortuosity attenuated, mild copper wiring   Periphery Attached, pigmented inferior cystoid degeneration, No RT/RD, No heme Attached, pigmented inferior cystoid degeneration, No heme, No RT/RD           Refraction     Wearing Rx       Sphere Cylinder Axis Add   Right -0.25 +0.25 150 +2.25   Left -0.50 +0.75 165 +2.25    Type: PAL            IMAGING AND PROCEDURES  Imaging and Procedures for @TODAY @  OCT, Retina - OU - Both Eyes       Right Eye Quality was good. Central Foveal Thickness: 365. Progression has been stable. Findings include no IRF, no SRF, abnormal foveal contour, epiretinal membrane, macular pucker (Stable ERM with pucker -- no significant change from prior).   Left Eye Quality was good. Central Foveal Thickness: 245. Progression has been stable. Findings include normal foveal contour, no IRF, no SRF, epiretinal membrane, vitreomacular adhesion (Mild focal ERM superior macula -- stable).   Notes *Images captured and stored on drive  Diagnosis / Impression:  ERM with pucker OU (OD>OS) OD: Stable ERM with pucker -- no significant change from prior OS: Mild focal ERM superior macula -- stable  Clinical management:  See below  Abbreviations: NFP - Normal foveal profile. CME - cystoid macular edema. PED - pigment epithelial detachment. IRF - intraretinal fluid. SRF - subretinal fluid. EZ - ellipsoid zone. ERM - epiretinal membrane. ORA - outer retinal atrophy. ORT - outer retinal tubulation. SRHM - subretinal hyper-reflective material            ASSESSMENT/PLAN:    ICD-10-CM   1. Epiretinal membrane (ERM) of both eyes  H35.373 OCT, Retina - OU - Both Eyes    2. Combined forms of age-related cataract of left eye  H25.812     3. Pseudophakia  Z96.1     4. Combined forms of age-related cataract of both eyes  H25.813      1,2. Epiretinal membrane, OU (OD>OS)  - mild ERM OU  - BCVA 20/20 OU  - OCT shows OD: OD: Stable ERM with pucker -- no significant change from prior; OS: Mild focal ERM superior macula -- stable  -  no metamorphopsia  - no indication for surgery  - recommend continued monitoring  - f/u 1 year, DFE, OCT  3. Mixed form cataracts OS - The symptoms of cataract, surgical options, and treatments and risks were discussed with patient. - discussed diagnosis and progression -  under the expert management of Dr. Cathey Endow  4. Pseudophakia OD  - s/p CE/IOL OD (Dr. Cathey Endow, January 2022)  - IOL in good position, doing well - monitor  Ophthalmic Meds Ordered this visit:  No orders of the defined types were placed in this encounter.    Return in about 1 year (around 09/14/2023) for f/u ERM OU, DFE, OCT.  There are no Patient Instructions on file for this visit.  This document serves as a record of services personally performed by Karie Chimera, MD, PhD. It was created on their behalf by Gerilyn Nestle, COT an ophthalmic technician. The creation of this record is the provider's dictation and/or activities during the visit.    Electronically signed by:  Gerilyn Nestle, COT  04.16.24 8:39 PM  This document serves as a record of services personally performed by Karie Chimera, MD, PhD. It was created on their behalf by Glee Arvin. Manson Passey, OA an ophthalmic technician. The creation of this record is the provider's dictation and/or activities during the visit.    Electronically signed by: Glee Arvin. Manson Passey, [place] 04.30.2024 8:39 PM  Karie Chimera, M.D., Ph.D. Diseases & Surgery of the Retina and Vitreous [facility]  I have reviewed the above documentation for accuracy and completeness, and I agree with the above. Karie Chimera, M.D., Ph.D. 09/14/22 8:41 PM   Abbreviations: M myopia (nearsighted); A astigmatism; H hyperopia (farsighted); P presbyopia; Mrx spectacle prescription;  CTL contact lenses; OD right eye; OS left eye; OU both eyes  XT exotropia; ET esotropia; PEK punctate epithelial keratitis; PEE punctate epithelial erosions; DES dry eye syndrome; MGD meibomian gland dysfunction; ATs artificial tears; PFAT's preservative free artificial tears; NSC nuclear sclerotic cataract; PSC posterior subcapsular cataract; ERM epi-retinal membrane; PVD posterior vitreous detachment; RD retinal detachment; DM diabetes mellitus; DR diabetic retinopathy;  NPDR non-proliferative diabetic retinopathy; PDR proliferative diabetic retinopathy; CSME clinically significant macular edema; DME diabetic macular edema; dbh dot blot hemorrhages; CWS cotton wool spot; POAG primary open angle glaucoma; C/D cup-to-disc ratio; HVF humphrey visual field; GVF goldmann visual field; OCT optical coherence tomography; IOP intraocular pressure; BRVO Branch retinal vein occlusion; CRVO central retinal vein occlusion; CRAO central retinal artery occlusion; BRAO branch retinal artery occlusion; RT retinal tear; SB scleral buckle; PPV pars plana vitrectomy; VH Vitreous hemorrhage; PRP panretinal laser photocoagulation; IVK intravitreal kenalog; VMT vitreomacular traction; MH Macular hole;  NVD neovascularization of the disc; NVE neovascularization elsewhere; AREDS age related eye disease study; ARMD age related macular degeneration; POAG primary open angle glaucoma; EBMD epithelial/anterior basement membrane dystrophy; ACIOL anterior chamber intraocular lens; IOL intraocular lens; PCIOL posterior chamber intraocular lens; Phaco/IOL phacoemulsification with intraocular lens placement; PRK photorefractive keratectomy; LASIK laser assisted in situ keratomileusis; HTN hypertension; DM diabetes mellitus; COPD chronic obstructive pulmonary disease

## 2022-09-13 ENCOUNTER — Encounter (INDEPENDENT_AMBULATORY_CARE_PROVIDER_SITE_OTHER): Payer: 59 | Admitting: Ophthalmology

## 2022-09-13 DIAGNOSIS — M791 Myalgia, unspecified site: Secondary | ICD-10-CM | POA: Diagnosis not present

## 2022-09-13 DIAGNOSIS — M359 Systemic involvement of connective tissue, unspecified: Secondary | ICD-10-CM | POA: Diagnosis not present

## 2022-09-14 ENCOUNTER — Encounter (INDEPENDENT_AMBULATORY_CARE_PROVIDER_SITE_OTHER): Payer: Self-pay | Admitting: Ophthalmology

## 2022-09-14 ENCOUNTER — Ambulatory Visit (INDEPENDENT_AMBULATORY_CARE_PROVIDER_SITE_OTHER): Payer: Medicare Other | Admitting: Ophthalmology

## 2022-09-14 DIAGNOSIS — Z961 Presence of intraocular lens: Secondary | ICD-10-CM

## 2022-09-14 DIAGNOSIS — H35373 Puckering of macula, bilateral: Secondary | ICD-10-CM

## 2022-09-14 DIAGNOSIS — H25812 Combined forms of age-related cataract, left eye: Secondary | ICD-10-CM | POA: Diagnosis not present

## 2022-09-14 DIAGNOSIS — H25813 Combined forms of age-related cataract, bilateral: Secondary | ICD-10-CM

## 2022-11-24 ENCOUNTER — Other Ambulatory Visit: Payer: Self-pay | Admitting: Internal Medicine

## 2022-11-24 DIAGNOSIS — K219 Gastro-esophageal reflux disease without esophagitis: Secondary | ICD-10-CM | POA: Diagnosis not present

## 2022-11-24 DIAGNOSIS — Z0000 Encounter for general adult medical examination without abnormal findings: Secondary | ICD-10-CM | POA: Diagnosis not present

## 2022-11-24 DIAGNOSIS — M8580 Other specified disorders of bone density and structure, unspecified site: Secondary | ICD-10-CM

## 2022-11-24 DIAGNOSIS — M8589 Other specified disorders of bone density and structure, multiple sites: Secondary | ICD-10-CM | POA: Diagnosis not present

## 2022-11-24 DIAGNOSIS — E559 Vitamin D deficiency, unspecified: Secondary | ICD-10-CM | POA: Diagnosis not present

## 2022-11-24 DIAGNOSIS — E7800 Pure hypercholesterolemia, unspecified: Secondary | ICD-10-CM | POA: Diagnosis not present

## 2022-11-30 DIAGNOSIS — D2261 Melanocytic nevi of right upper limb, including shoulder: Secondary | ICD-10-CM | POA: Diagnosis not present

## 2022-11-30 DIAGNOSIS — D225 Melanocytic nevi of trunk: Secondary | ICD-10-CM | POA: Diagnosis not present

## 2022-11-30 DIAGNOSIS — D235 Other benign neoplasm of skin of trunk: Secondary | ICD-10-CM | POA: Diagnosis not present

## 2022-11-30 DIAGNOSIS — D224 Melanocytic nevi of scalp and neck: Secondary | ICD-10-CM | POA: Diagnosis not present

## 2023-01-05 DIAGNOSIS — H04123 Dry eye syndrome of bilateral lacrimal glands: Secondary | ICD-10-CM | POA: Diagnosis not present

## 2023-01-05 DIAGNOSIS — R0600 Dyspnea, unspecified: Secondary | ICD-10-CM | POA: Diagnosis not present

## 2023-01-05 DIAGNOSIS — I7300 Raynaud's syndrome without gangrene: Secondary | ICD-10-CM | POA: Diagnosis not present

## 2023-01-05 DIAGNOSIS — M359 Systemic involvement of connective tissue, unspecified: Secondary | ICD-10-CM | POA: Diagnosis not present

## 2023-05-26 IMAGING — MG MM DIGITAL SCREENING BILAT W/ TOMO AND CAD
8 series · 9 of 24 positions shown · non-contrast
Comparison: Previous exam(s).

CLINICAL DATA: Screening.

EXAM:
DIGITAL SCREENING BILATERAL MAMMOGRAM WITH TOMOSYNTHESIS AND CAD
TECHNIQUE: Bilateral screening digital craniocaudal and mediolateral oblique
mammograms were obtained. Bilateral screening digital breast
tomosynthesis was performed. The images were evaluated with
computer-aided detection.

[R CC synth-2D]
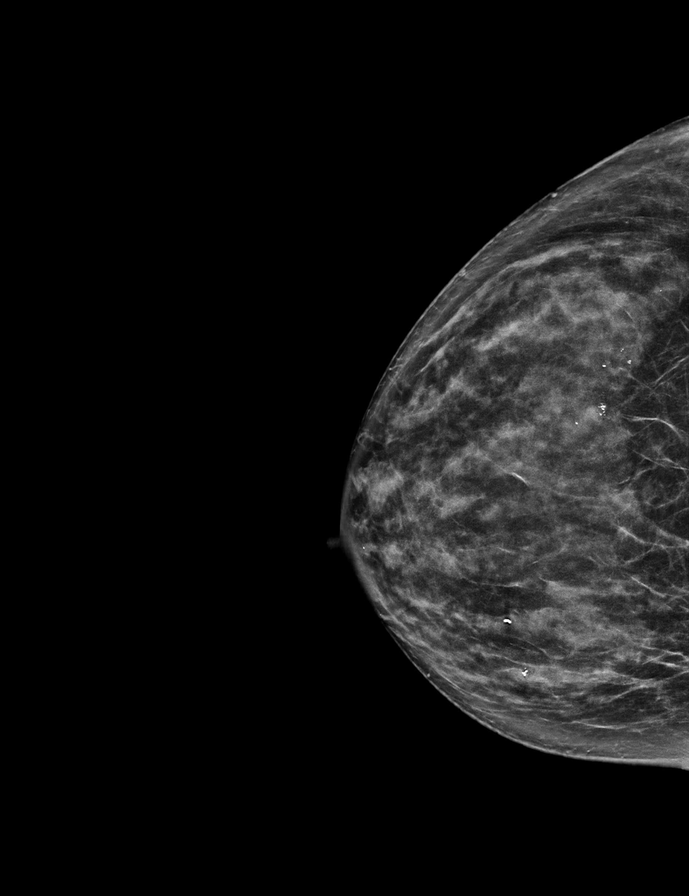

[L MLO synth-2D]
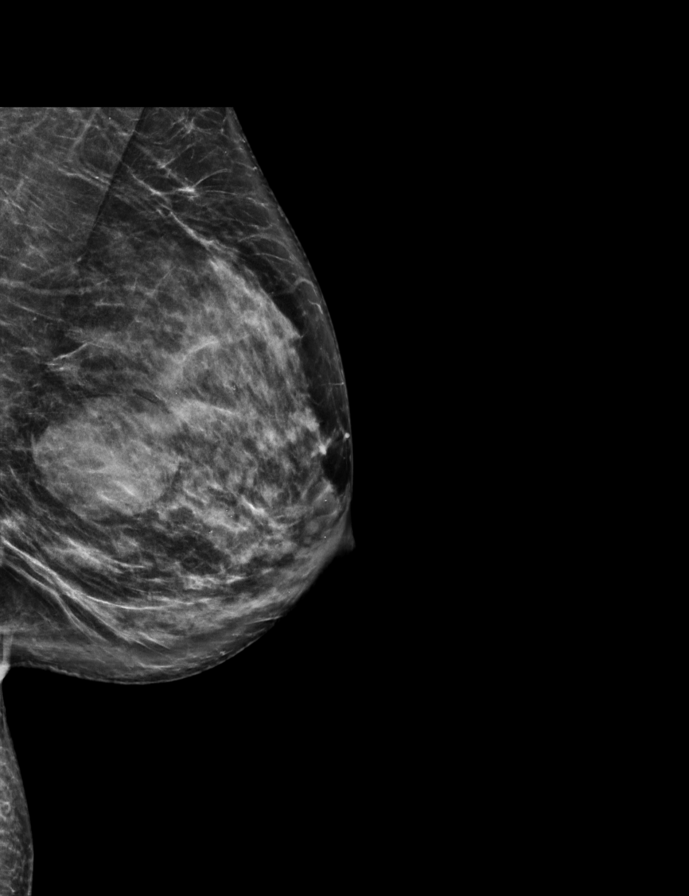

[R MLO synth-2D]
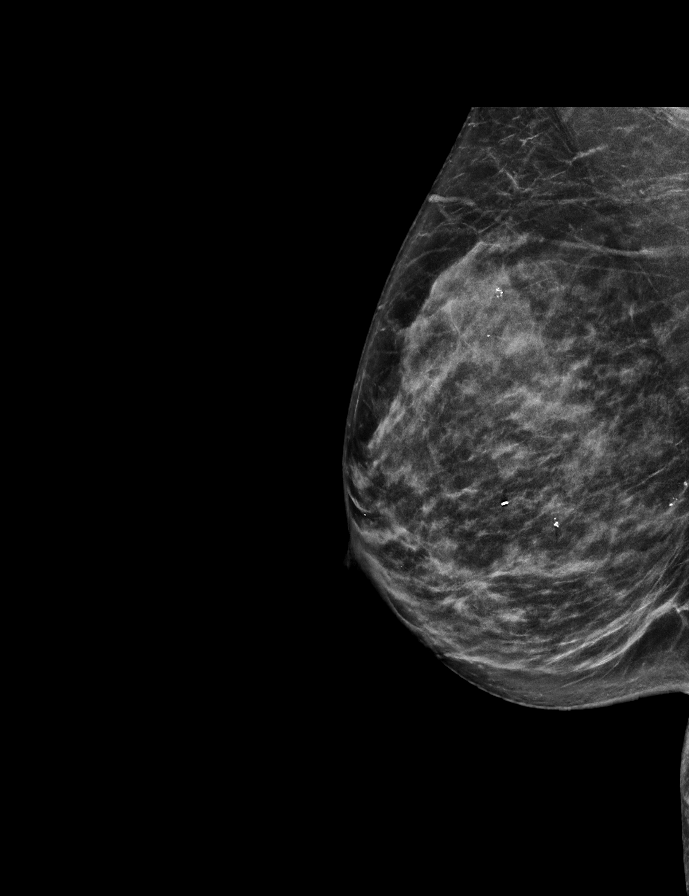

[L CC synth-2D]
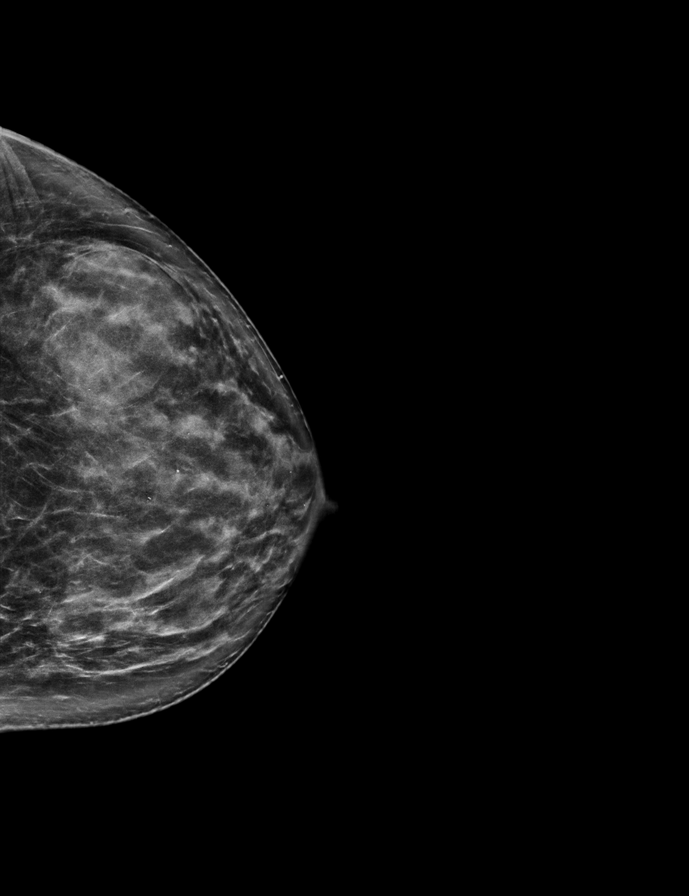

[L MLO tomo · 2 of 66 frames shown]
[frame 22/66]
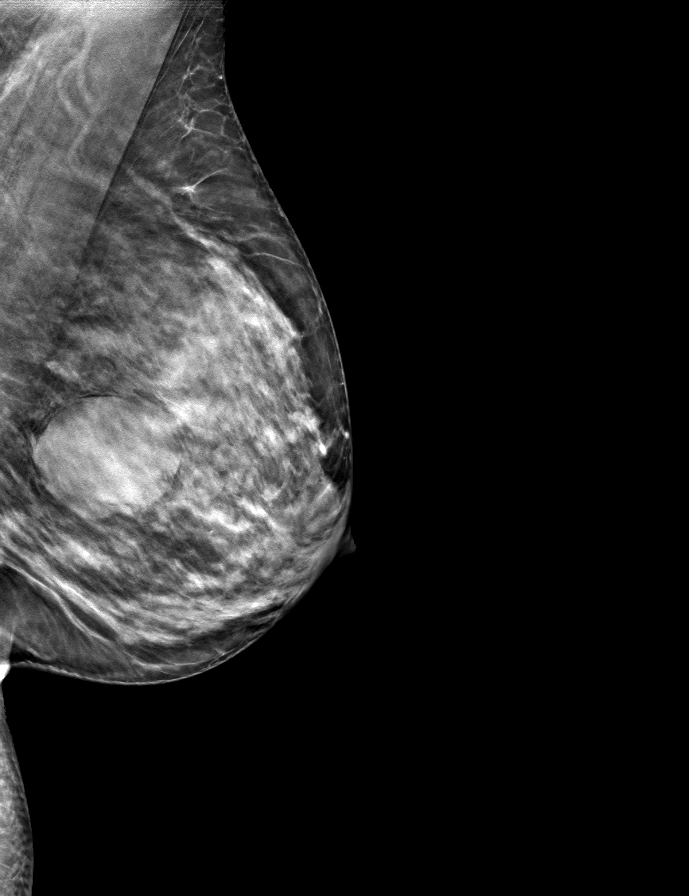
[frame 33/66]
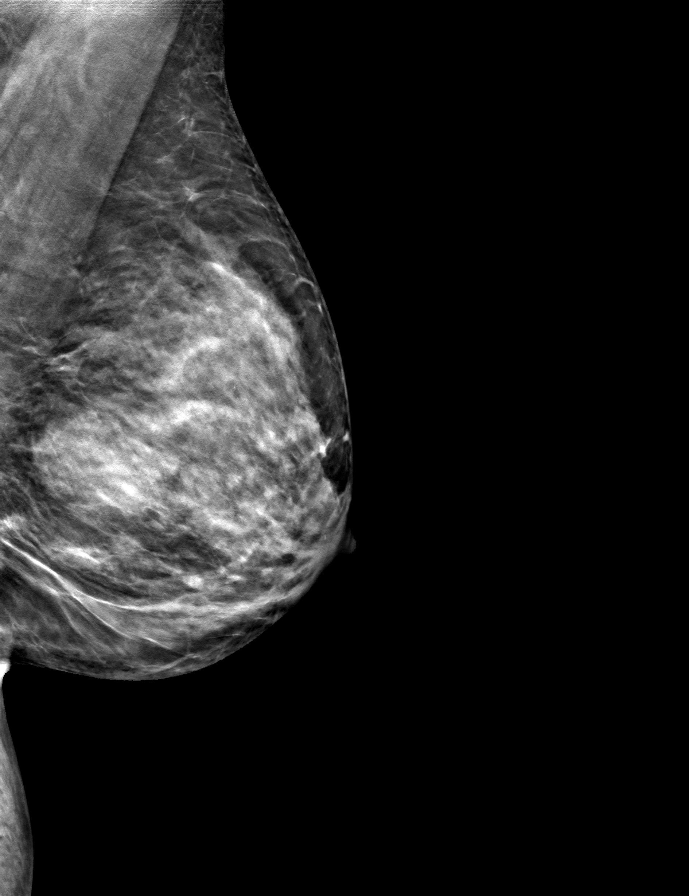

[R MLO tomo · tomo slice 32/63.0]
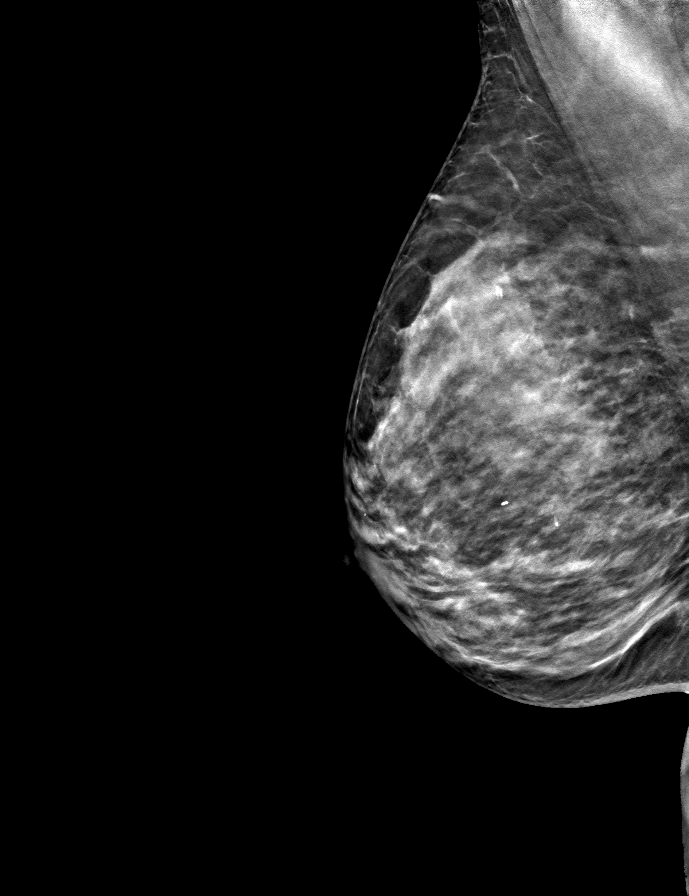

[L CC tomo · tomo slice 33/66.0]
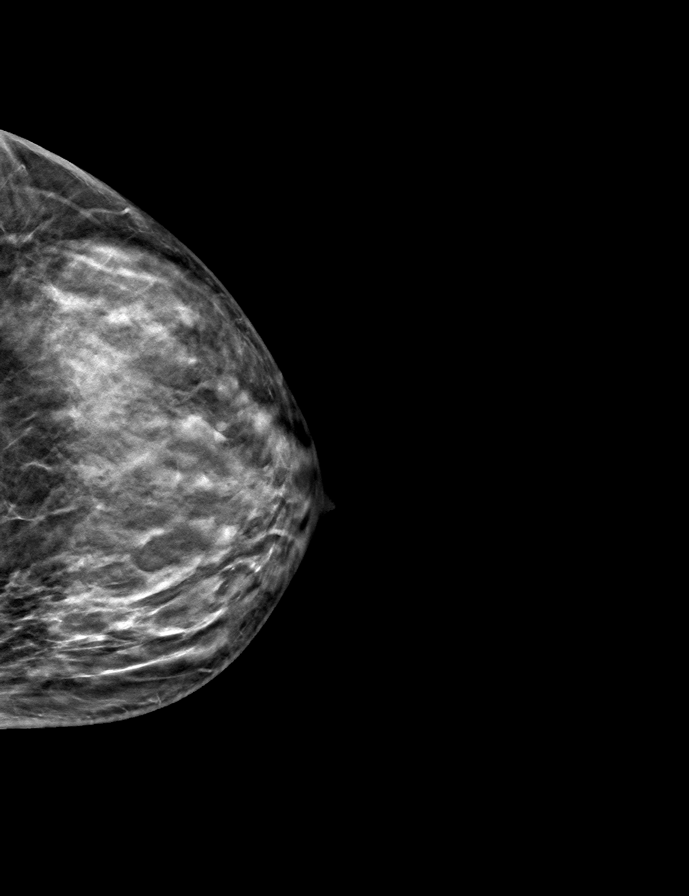

[R CC tomo · tomo slice 31/62.0]
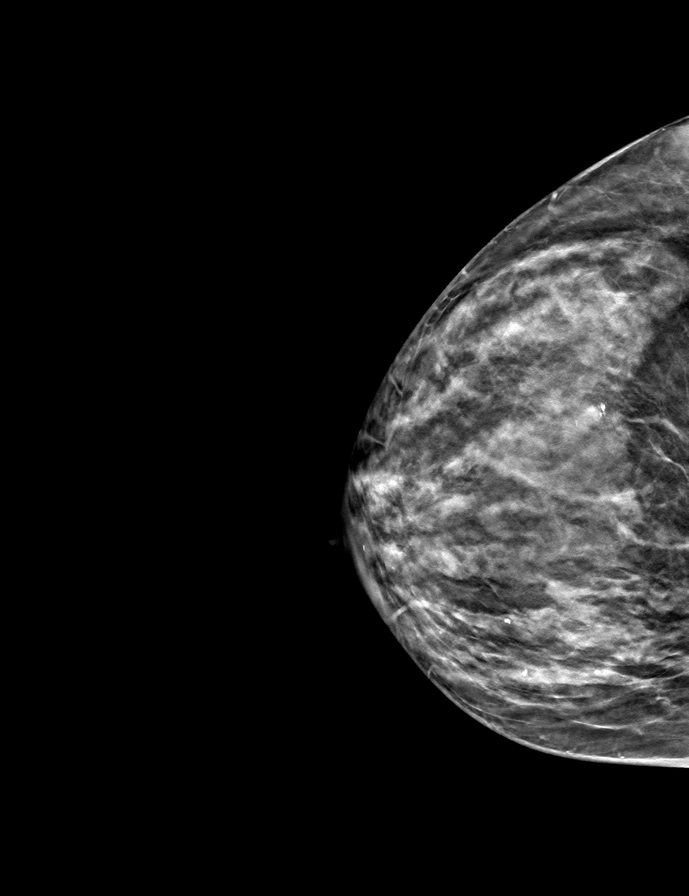

[9 of 24 positions shown; findings below may reference images not displayed]

ACR Breast Density Category c: The breast tissue is heterogeneously
dense, which may obscure small masses.
FINDINGS: There are no findings suspicious for malignancy.
IMPRESSION: No mammographic evidence of malignancy. A result letter of this
screening mammogram will be mailed directly to the patient.

RECOMMENDATION:
Screening mammogram in one year. (Code:[id number])

BI-RADS CATEGORY  1: Negative.

## 2023-06-28 ENCOUNTER — Other Ambulatory Visit: Payer: Self-pay | Admitting: Internal Medicine

## 2023-06-28 DIAGNOSIS — Z1231 Encounter for screening mammogram for malignant neoplasm of breast: Secondary | ICD-10-CM

## 2023-07-06 DIAGNOSIS — H04123 Dry eye syndrome of bilateral lacrimal glands: Secondary | ICD-10-CM | POA: Diagnosis not present

## 2023-07-06 DIAGNOSIS — Z6824 Body mass index (BMI) 24.0-24.9, adult: Secondary | ICD-10-CM | POA: Diagnosis not present

## 2023-07-06 DIAGNOSIS — R682 Dry mouth, unspecified: Secondary | ICD-10-CM | POA: Diagnosis not present

## 2023-07-06 DIAGNOSIS — I7300 Raynaud's syndrome without gangrene: Secondary | ICD-10-CM | POA: Diagnosis not present

## 2023-07-06 DIAGNOSIS — M3509 Sicca syndrome with other organ involvement: Secondary | ICD-10-CM | POA: Diagnosis not present

## 2023-07-06 DIAGNOSIS — R0600 Dyspnea, unspecified: Secondary | ICD-10-CM | POA: Diagnosis not present

## 2023-07-06 DIAGNOSIS — M359 Systemic involvement of connective tissue, unspecified: Secondary | ICD-10-CM | POA: Diagnosis not present

## 2023-07-08 ENCOUNTER — Ambulatory Visit
Admission: RE | Admit: 2023-07-08 | Discharge: 2023-07-08 | Disposition: A | Discharge status: Home / Self Care | Payer: Medicare HMO | Source: Ambulatory Visit | Attending: Internal Medicine | Admitting: Internal Medicine

## 2023-07-08 DIAGNOSIS — Z90722 Acquired absence of ovaries, bilateral: Secondary | ICD-10-CM | POA: Diagnosis not present

## 2023-07-08 DIAGNOSIS — M8580 Other specified disorders of bone density and structure, unspecified site: Secondary | ICD-10-CM

## 2023-07-08 DIAGNOSIS — E2839 Other primary ovarian failure: Secondary | ICD-10-CM | POA: Diagnosis not present

## 2023-07-08 DIAGNOSIS — M8588 Other specified disorders of bone density and structure, other site: Secondary | ICD-10-CM | POA: Diagnosis not present

## 2023-07-14 DIAGNOSIS — M810 Age-related osteoporosis without current pathological fracture: Secondary | ICD-10-CM | POA: Diagnosis not present

## 2023-07-26 ENCOUNTER — Other Ambulatory Visit: Payer: Self-pay | Admitting: Gastroenterology

## 2023-07-26 DIAGNOSIS — K7689 Other specified diseases of liver: Secondary | ICD-10-CM | POA: Diagnosis not present

## 2023-07-26 DIAGNOSIS — Z860100 Personal history of colon polyps, unspecified: Secondary | ICD-10-CM | POA: Diagnosis not present

## 2023-07-26 DIAGNOSIS — R1310 Dysphagia, unspecified: Secondary | ICD-10-CM | POA: Diagnosis not present

## 2023-07-26 DIAGNOSIS — K219 Gastro-esophageal reflux disease without esophagitis: Secondary | ICD-10-CM | POA: Diagnosis not present

## 2023-07-29 ENCOUNTER — Ambulatory Visit
Admission: RE | Admit: 2023-07-29 | Discharge: 2023-07-29 | Disposition: A | Discharge status: Home / Self Care | Source: Ambulatory Visit | Attending: Gastroenterology | Admitting: Gastroenterology

## 2023-07-29 ENCOUNTER — Other Ambulatory Visit (HOSPITAL_COMMUNITY): Payer: Self-pay

## 2023-07-29 DIAGNOSIS — I272 Pulmonary hypertension, unspecified: Secondary | ICD-10-CM

## 2023-07-29 DIAGNOSIS — K760 Fatty (change of) liver, not elsewhere classified: Secondary | ICD-10-CM | POA: Diagnosis not present

## 2023-07-29 DIAGNOSIS — K7689 Other specified diseases of liver: Secondary | ICD-10-CM

## 2023-08-04 ENCOUNTER — Ambulatory Visit (HOSPITAL_COMMUNITY)
Admission: RE | Admit: 2023-08-04 | Discharge: 2023-08-04 | Disposition: A | Discharge status: Home / Self Care | Source: Ambulatory Visit | Attending: Cardiology | Admitting: Cardiology

## 2023-08-04 DIAGNOSIS — I272 Pulmonary hypertension, unspecified: Secondary | ICD-10-CM | POA: Diagnosis not present

## 2023-08-04 LAB — PULMONARY FUNCTION TEST
DL/VA % pred: 92 %
DL/VA: 3.89 ml/min/mmHg/L
DLCO unc % pred: 85 %
DLCO unc: 16.33 ml/min/mmHg
FEF 25-75 Pre: 2.8 L/s
FEF2575-%Pred-Pre: 138 %
FEV1-%Pred-Pre: 106 %
FEV1-Pre: 2.43 L
FEV1FVC-%Pred-Pre: 106 %
FEV6-%Pred-Pre: 102 %
FEV6-Pre: 2.96 L
FEV6FVC-%Pred-Pre: 104 %
FVC-%Pred-Pre: 98 %
FVC-Pre: 2.97 L
Pre FEV1/FVC ratio: 82 %
Pre FEV6/FVC Ratio: 100 %
RV % pred: 81 %
RV: 1.68 L
TLC % pred: 92 %
TLC: 4.54 L

## 2023-08-12 ENCOUNTER — Ambulatory Visit
Admission: RE | Admit: 2023-08-12 | Discharge: 2023-08-12 | Disposition: A | Discharge status: Home / Self Care | Payer: Medicare Other | Source: Ambulatory Visit | Attending: Internal Medicine | Admitting: Internal Medicine

## 2023-08-12 DIAGNOSIS — Z1231 Encounter for screening mammogram for malignant neoplasm of breast: Secondary | ICD-10-CM

## 2023-08-24 DIAGNOSIS — K5730 Diverticulosis of large intestine without perforation or abscess without bleeding: Secondary | ICD-10-CM | POA: Diagnosis not present

## 2023-08-24 DIAGNOSIS — R1310 Dysphagia, unspecified: Secondary | ICD-10-CM | POA: Diagnosis not present

## 2023-08-24 DIAGNOSIS — K219 Gastro-esophageal reflux disease without esophagitis: Secondary | ICD-10-CM | POA: Diagnosis not present

## 2023-08-24 DIAGNOSIS — K3189 Other diseases of stomach and duodenum: Secondary | ICD-10-CM | POA: Diagnosis not present

## 2023-08-24 DIAGNOSIS — Z09 Encounter for follow-up examination after completed treatment for conditions other than malignant neoplasm: Secondary | ICD-10-CM | POA: Diagnosis not present

## 2023-08-24 DIAGNOSIS — D120 Benign neoplasm of cecum: Secondary | ICD-10-CM | POA: Diagnosis not present

## 2023-08-24 DIAGNOSIS — Z860101 Personal history of adenomatous and serrated colon polyps: Secondary | ICD-10-CM | POA: Diagnosis not present

## 2023-08-24 DIAGNOSIS — K2930 Chronic superficial gastritis without bleeding: Secondary | ICD-10-CM | POA: Diagnosis not present

## 2023-08-26 DIAGNOSIS — D120 Benign neoplasm of cecum: Secondary | ICD-10-CM | POA: Diagnosis not present

## 2023-08-26 DIAGNOSIS — K219 Gastro-esophageal reflux disease without esophagitis: Secondary | ICD-10-CM | POA: Diagnosis not present

## 2023-08-26 DIAGNOSIS — K2930 Chronic superficial gastritis without bleeding: Secondary | ICD-10-CM | POA: Diagnosis not present

## 2023-09-06 ENCOUNTER — Ambulatory Visit (HOSPITAL_COMMUNITY)
Admission: RE | Admit: 2023-09-06 | Discharge: 2023-09-06 | Disposition: A | Discharge status: Home / Self Care | Source: Ambulatory Visit | Attending: Cardiology | Admitting: Cardiology

## 2023-09-06 ENCOUNTER — Encounter (HOSPITAL_COMMUNITY): Payer: Self-pay | Admitting: Cardiology

## 2023-09-06 VITALS — BP 118/70 | HR 97 | Wt 142.0 lb

## 2023-09-06 DIAGNOSIS — R0609 Other forms of dyspnea: Secondary | ICD-10-CM | POA: Insufficient documentation

## 2023-09-06 DIAGNOSIS — I272 Pulmonary hypertension, unspecified: Secondary | ICD-10-CM | POA: Insufficient documentation

## 2023-09-06 DIAGNOSIS — I509 Heart failure, unspecified: Secondary | ICD-10-CM | POA: Insufficient documentation

## 2023-09-06 DIAGNOSIS — M351 Other overlap syndromes: Secondary | ICD-10-CM | POA: Insufficient documentation

## 2023-09-06 DIAGNOSIS — Z79899 Other long term (current) drug therapy: Secondary | ICD-10-CM | POA: Insufficient documentation

## 2023-09-06 DIAGNOSIS — Z8249 Family history of ischemic heart disease and other diseases of the circulatory system: Secondary | ICD-10-CM | POA: Diagnosis not present

## 2023-09-06 NOTE — Patient Instructions (Signed)
 There has been no changes to your medications.  Your physician has requested that you have an echocardiogram. Echocardiography is a painless test that uses sound waves to create images of your heart. It provides your doctor with information about the size and shape of your heart and how well your heart's chambers and valves are working. This procedure takes approximately one hour. There are no restrictions for this procedure. Please do NOT wear cologne, perfume, aftershave, or lotions (deodorant is allowed). Please arrive 15 minutes prior to your appointment time.  Please note: We ask at that you not bring children with you during ultrasound (echo/ vascular) testing. Due to room size and safety concerns, children are not allowed in the ultrasound rooms during exams. Our front office staff cannot provide observation of children in our lobby area while testing is being conducted. An adult accompanying a patient to their appointment will only be allowed in the ultrasound room at the discretion of the ultrasound technician under special circumstances. We apologize for any inconvenience.  Your physician recommends that you schedule a follow-up appointment in: 6 months( October) ** PLEASE CALL THE OFFICE IN [place] OT ARRANGE YOUR FOLLOW UP APPOINTMENT.**  If you have any questions or concerns before your next appointment please send us  a message through [place] or call our office at [phone].    TO LEAVE A MESSAGE FOR THE NURSE SELECT OPTION 2, PLEASE LEAVE A MESSAGE INCLUDING: YOUR NAME DATE OF BIRTH CALL BACK NUMBER REASON FOR CALL**this is important as we prioritize the call backs  YOU WILL RECEIVE A CALL BACK THE SAME DAY AS LONG AS YOU CALL BEFORE 4:00 PM  At the [facility], you and your health needs are our priority. As part of our continuing mission to provide you with exceptional heart care, we have created designated Provider Care Teams. These Care Teams include your  primary Cardiologist (physician) and Advanced Practice Providers (APPs- Physician Assistants and Nurse Practitioners) who all work together to provide you with the care you need, when you need it.   You may see any of the following providers on your designated Care Team at your next follow up: Dr Jules Oar Dr Peder Bourdon Dr. Alwin Baars Dr. Arta Lark Amy Marijane Shoulders, NP Ruddy Corral, [place] [facility] [place], [place] Dennise Fitz, NP [place] Lee, NP [place] Dellen, NP Luster Salters, PharmD Bevely Brush, PharmD   Please be sure to bring in all your medications bottles to every appointment.    Thank you for choosing [facility]

## 2023-09-06 NOTE — Progress Notes (Signed)
 PCP: Tena Feeling, MD Rheumatologist: Dr. Roetta Clarke HF Cardiology: Dr. Mitzie Anda  68 y.o. 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  Patient has history of dry eyes/mouth and Raynaud's phenomenon.  She was referred to a rheumatologist who thinks that she may have MCTD.  She has no history of cardiac problems. Since the summer of 2023, she has noted exertional dyspnea.    Echo was done, showing EF 60-65%, normal RV, normal IVC, TR inadequate to assess PA pressure.  PFTs were done and were noted to be normal including DLCO.   She saw Dr Mitzie Anda and was set up for RHC. 2024 RHC -normal filling pressures and normal PA pressure.   Today she returns for HF follow up.Overall feeling fine. She has good days and bad days. Occasionally short of breath. Denies PND/Orthopnea. Appetite ok. No fever or chills. Weight at home stable. Taking all medications.  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  2. Hyperlipidemia.  3. GERD: Hiatal hernia.  4. Dyspnea: Echo (12/23) with EF 60-65%, normal RV, normal IVC, TR inadequate to assess PA pressure.   - PFTs (12/23): normal including DLCO. -PFTs (07/2023):   5. RHC - 2024 normal filling pressure, CO 6.3, CI 3.7.   Social History   Socioeconomic History   Marital status: Single    Spouse name: Not on file   Number of children: Not on file   Years of education: Not on file   Highest education level: Not on file  Occupational History   Not on file  Tobacco Use   Smoking status: Never   Smokeless tobacco: Never  Vaping Use   Vaping status: Never Used  Substance and Sexual Activity   Alcohol  use: No   Drug use: No   Sexual activity: Not on file  Other Topics Concern   Not on file  Social History Narrative   Not on file   Social Drivers of Health   Financial Resource Strain: Not on file  Food Insecurity: Not on file   Transportation Needs: Not on file  Physical Activity: Not on file  Stress: Not on file  Social Connections: Not on file  Intimate Partner Violence: Not on file   Family History  Problem Relation Age of Onset   Hypertension Mother    Hypertension Father    Hypertension Brother    Breast cancer Paternal Aunt    Diabetes Maternal Grandmother    ROS: All systems reviewed and negative except as per HPI.   Current Outpatient Medications  Medication Sig Dispense Refill   Acetaminophen  (TYLENOL  DISSOLVE PACKS) 500 MG PACK Take 500-1,000 mg by mouth every 6 (six) hours as needed (pain).     atorvastatin (LIPITOR) 10 MG tablet Take 10 mg by mouth at bedtime.     bisacodyl 5 MG EC tablet Take 5-15 mg by mouth daily as needed for moderate constipation.     Calcium Carbonate-Vitamin D (CALCIUM-D PO) Take 2 tablets by mouth daily.     Cyanocobalamin (B-12) 5000 MCG CAPS Take 5,000 mcg by mouth daily.     estradiol (CLIMARA - DOSED IN MG/24 HR) 0.0375 mg/24hr patch Place 0.0375 mg onto the skin every Monday.     Multiple Vitamins-Minerals (ADULT GUMMY PO) Take 2 capsules by mouth daily.     naproxen sodium (ALEVE) 220 MG tablet Take 220 mg by mouth daily as needed (pain).  omeprazole (PRILOSEC) 20 MG capsule Take 20 mg by mouth daily as needed (acid reflux).     polyethylene glycol (MIRALAX  / GLYCOLAX ) 17 g packet Take 8.5-17 g by mouth daily as needed for moderate constipation.     Probiotic CHEW Chew 2 capsules by mouth daily.     Propylene Glycol (SYSTANE COMPLETE) 0.6 % SOLN Place 1 drop into both eyes 4 (four) times daily.     pseudoephedrine  (SUDAFED) 30 MG tablet Take 60 mg by mouth every 6 (six) hours as needed for congestion.     simethicone  (MYLICON) 125 MG chewable tablet Chew 125 mg by mouth every 6 (six) hours as needed for flatulence.     sodium chloride  (OCEAN) 0.65 % SOLN nasal spray Place 1 spray into both nostrils as needed for congestion.     vitamin E 180 MG (400 UNITS)  capsule Take 400 Units by mouth 2 (two) times daily.     antiseptic oral rinse (BIOTENE) LIQD 15 mLs by Mouth Rinse route as needed for dry mouth. (Patient not taking: Reported on 09/06/2023)     No current facility-administered medications for this encounter.   BP 118/70   Pulse 97   Wt 64.4 kg (142 lb)   SpO2 100%   BMI 25.15 kg/m  General:   No resp difficulty Neck: supple. no JVD.  Cor: PMI nondisplaced. Regular rate & rhythm. No rubs, gallops or murmurs. Lungs: clear Abdomen: soft, nontender, nondistended.  Extremities: no cyanosis, clubbing, rash, edema Neuro: alert & oriented x3  EKG: SR 88 bpm   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  2. Exertional dyspnea: Slowly progressive.  PFTs were normal including DLCO in 12/23.  Echo in 12/23 showed EF 60-65%, normal RV, normal IVC, TR inadequate to assess PA pressure.  ECG normal.  With mixed connective tissue disorder, interstitial lung disease and pulmonary hypertension are considerations.  PFTs show no evidence for ILD.  Echo showed normal LV and RV function, but unable to estimate PA pressure. She is not volume overloaded on exam.  - RHC was negative for pulmonary hypertension. Still with some shortness of breath. Does not have volume overload.  -Repeat ECHO  Follow up with Dr Mitzie Anda in 6 months.     Everline Mahaffy NP-C  09/06/2023

## 2023-09-14 ENCOUNTER — Encounter (INDEPENDENT_AMBULATORY_CARE_PROVIDER_SITE_OTHER): Payer: Medicare Other | Admitting: Ophthalmology

## 2023-09-19 DIAGNOSIS — H524 Presbyopia: Secondary | ICD-10-CM | POA: Diagnosis not present

## 2023-09-19 DIAGNOSIS — H2513 Age-related nuclear cataract, bilateral: Secondary | ICD-10-CM | POA: Diagnosis not present

## 2023-09-19 DIAGNOSIS — H35373 Puckering of macula, bilateral: Secondary | ICD-10-CM | POA: Diagnosis not present

## 2023-09-21 ENCOUNTER — Encounter (INDEPENDENT_AMBULATORY_CARE_PROVIDER_SITE_OTHER): Payer: Self-pay | Admitting: Ophthalmology

## 2023-09-21 ENCOUNTER — Ambulatory Visit (INDEPENDENT_AMBULATORY_CARE_PROVIDER_SITE_OTHER): Admitting: Ophthalmology

## 2023-09-21 DIAGNOSIS — H35373 Puckering of macula, bilateral: Secondary | ICD-10-CM

## 2023-09-21 DIAGNOSIS — H25813 Combined forms of age-related cataract, bilateral: Secondary | ICD-10-CM

## 2023-09-21 DIAGNOSIS — H25812 Combined forms of age-related cataract, left eye: Secondary | ICD-10-CM

## 2023-09-21 DIAGNOSIS — Z961 Presence of intraocular lens: Secondary | ICD-10-CM

## 2023-09-21 NOTE — Progress Notes (Signed)
 [facility] - Clinic Note  09/21/2023     CHIEF COMPLAINT Patient presents for Retina Follow Up    HISTORY OF PRESENT ILLNESS: [name] is a 68 y.o. female who presents to the clinic today for:   HPI     Retina Follow Up   In both eyes.  This started 1 year ago.  Duration of 1 year.  Since onset it is stable.  I, the attending physician,  performed the HPI with the patient and updated documentation appropriately.        Comments   1 year retina follow up ERM OU pt is reporting no vision changes noticed she denies any flashes has some floaters pt saw Dr Ambrosio Junker on Monday for her yearly exam       Last edited by Ronelle Coffee, MD on 09/21/2023  1:31 PM.     Pt states both eyes seem blurry to her, right eye worse than the left, she is not seeing fol and "cascades" like she used to, she saw Dr.Bowen on Monday, he said she did not need cataract sx yet  Referring physician: Tena Feeling, MD [address] [place],  [place] [address]  HISTORICAL INFORMATION:   Selected notes from the MEDICAL RECORD NUMBER Referred by Dr. Marshell Skelton for concern of ERM OU LEE: 05.01.21 (L. Mazarella) Ocular Hx- PMH-   CURRENT MEDICATIONS: Current Outpatient Medications (Ophthalmic Drugs)  Medication Sig   Propylene Glycol (SYSTANE COMPLETE) 0.6 % SOLN Place 1 drop into both eyes 4 (four) times daily.   No current facility-administered medications for this visit. (Ophthalmic Drugs)   Current Outpatient Medications (Other)  Medication Sig   Acetaminophen  (TYLENOL  DISSOLVE PACKS) 500 MG PACK Take 500-1,000 mg by mouth every 6 (six) hours as needed (pain).   antiseptic oral rinse (BIOTENE) LIQD 15 mLs by Mouth Rinse route as needed for dry mouth. (Patient not taking: Reported on 09/06/2023)   atorvastatin (LIPITOR) 10 MG tablet Take 10 mg by mouth at bedtime.   bisacodyl 5 MG EC tablet Take 5-15 mg by mouth daily as needed for moderate constipation.    Calcium Carbonate-Vitamin D (CALCIUM-D PO) Take 2 tablets by mouth daily.   Cyanocobalamin (B-12) 5000 MCG CAPS Take 5,000 mcg by mouth daily.   estradiol (CLIMARA - DOSED IN MG/24 HR) 0.0375 mg/24hr patch Place 0.0375 mg onto the skin every Monday.   Multiple Vitamins-Minerals (ADULT GUMMY PO) Take 2 capsules by mouth daily.   naproxen sodium (ALEVE) 220 MG tablet Take 220 mg by mouth daily as needed (pain).   omeprazole (PRILOSEC) 20 MG capsule Take 20 mg by mouth daily as needed (acid reflux).   polyethylene glycol (MIRALAX  / GLYCOLAX ) 17 g packet Take 8.5-17 g by mouth daily as needed for moderate constipation.   Probiotic CHEW Chew 2 capsules by mouth daily.   pseudoephedrine  (SUDAFED) 30 MG tablet Take 60 mg by mouth every 6 (six) hours as needed for congestion.   simethicone  (MYLICON) 125 MG chewable tablet Chew 125 mg by mouth every 6 (six) hours as needed for flatulence.   sodium chloride  (OCEAN) 0.65 % SOLN nasal spray Place 1 spray into both nostrils as needed for congestion.   vitamin E 180 MG (400 UNITS) capsule Take 400 Units by mouth 2 (two) times daily.   No current facility-administered medications for this visit. (Other)   REVIEW OF SYSTEMS: ROS   Positive for: Gastrointestinal, Eyes Negative for: Constitutional, Neurological, Skin, Genitourinary, Musculoskeletal,  HENT, Endocrine, Cardiovascular, Respiratory, Psychiatric, Allergic/Imm, Heme/Lymph Last edited by Alise Appl, COT on 09/21/2023 12:52 PM.      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 Cindra Cree   CHOLECYSTECTOMY     COLONOSCOPY WITH PROPOFOL  N/A 06/12/2018   Procedure: COLONOSCOPY WITH PROPOFOL ;  Surgeon: Jolinda Necessary, MD;  Location: [facility];  Service: Endoscopy;  Laterality: N/A;   COLPOSCOPY     ESOPHAGOGASTRODUODENOSCOPY (EGD) WITH PROPOFOL  N/A 06/12/2018   Procedure: ESOPHAGOGASTRODUODENOSCOPY (EGD) WITH PROPOFOL ;  Surgeon: Jolinda Necessary, MD;  Location: [facility];  Service: Endoscopy;  Laterality: N/A;   EXTRACORPOREAL SHOCK WAVE LITHOTRIPSY Right 10/31/2017   Procedure: RIGHT EXTRACORPOREAL SHOCK WAVE LITHOTRIPSY (ESWL);  Surgeon: Adelbert Homans, MD;  Location: [facility];  Service: Urology;  Laterality: Right;   EXTRACORPOREAL SHOCK WAVE LITHOTRIPSY Left 03/13/2018   Procedure: LEFT EXTRACORPOREAL SHOCK WAVE LITHOTRIPSY (ESWL);  Surgeon: Adelbert Homans, MD;  Location: [facility];  Service: Urology;  Laterality: Left;   EYE SURGERY Right 06/11/2020   Cat Sx - Dr. Cindra Cree   LAPAROSCOPIC LIVER CYST REMOVAL N/A 09/27/2017   Procedure: LAPAROSCOPIC LIVER CYST UNROOFING;  Surgeon: Lockie Rima, MD;  Location: [facility];  Service: General;  Laterality: N/A;   POLYPECTOMY  06/12/2018   Procedure: POLYPECTOMY;  Surgeon: Jolinda Necessary, MD;  Location: [facility];  Service: Endoscopy;;   RIGHT HEART CATH N/A 06/15/2022   Procedure: RIGHT HEART CATH;  Surgeon: Darlis Eisenmenger, MD;  Location: [facility]ASIVE CV LAB;  Service: Cardiovascular;  Laterality: N/A;   WISDOM TOOTH EXTRACTION     FAMILY HISTORY Family History  Problem Relation Age of Onset   Hypertension Mother    Hypertension Father    Hypertension Brother    Breast cancer Paternal Aunt    Diabetes Maternal Grandmother    SOCIAL HISTORY Social History   Tobacco Use   Smoking status: Never   Smokeless tobacco: Never  Vaping Use   Vaping status: Never Used  Substance Use Topics   Alcohol  use: No   Drug use: No       OPHTHALMIC EXAM:  Base  Eye Exam     Visual Acuity (Snellen - Linear)       Right Left   Dist cc 20/20 -1 20/20    Correction: Glasses         Tonometry (Tonopen, 12:55 PM)       Right Left   Pressure 16 19         Pupils       Pupils Dark Light Shape React APD   Right PERRL 4 3 Round Brisk None   Left PERRL 4 3 Round Brisk None         Visual Fields       Left Right    Full Full         Extraocular Movement       Right Left  Full, Ortho Full, Ortho         Neuro/Psych     Oriented x3: Yes   Mood/Affect: Normal         Dilation     Both eyes: 2.5% Phenylephrine  @ 12:55 PM           Slit Lamp and Fundus Exam     Slit Lamp Exam       Right Left   Lids/Lashes Dermatochalasis - upper lid Dermatochalasis - upper lid   Conjunctiva/Sclera Nasal Pinguecula, mild Melanosis Nasal Pinguecula, mild Melanosis   Cornea Arcus, well healed cataract wound superiorly Arcus, trace Punctate epithelial erosions, mild tear film debris   Anterior Chamber deep and clear deep, clear, narrow temporal angle   Iris Round and dilated Round and dilated   Lens PC IOL in good position, trace Posterior capsular opacification 2-3+ Nuclear sclerosis with brunescence, 2-3+ Cortical cataract   Anterior Vitreous Vitreous syneresis, Posterior vitreous detachment, vitreous condensations mild syneresis         Fundus Exam       Right Left   Disc mild Pallor, Sharp rim, mild PPP Pink and Sharp, mild PPP   C/D Ratio 0.6 0.4   Macula Flat, good foveal reflex, mild epiretinal membrane with striae, no heme Flat, good foveal reflex, focal Epiretinal membrane with striae superior to fovea, no heme, RPE mottling   Vessels attenuated, mild tortuosity attenuated, mild copper wiring   Periphery Attached, pigmented inferior cystoid degeneration, No RT/RD, No heme Attached, pigmented inferior cystoid degeneration, No heme, No RT/RD           Refraction     Wearing Rx       Sphere Cylinder Axis  Add   Right -0.25 +0.25 150 +2.25   Left -0.50 +0.75 165 +2.25    Type: PAL           IMAGING AND PROCEDURES  Imaging and Procedures for @TODAY @  OCT, Retina - OU - Both Eyes        Right Eye Quality was good. Central Foveal Thickness: 327. Progression has been stable. Findings include no IRF, no SRF, abnormal foveal contour, epiretinal membrane, macular pucker (stable ERM with pucker -- interval improvement in foveal contour).   Left Eye Quality was good. Central Foveal Thickness: 242. Progression has been stable. Findings include normal foveal contour, no IRF, no SRF, epiretinal membrane, vitreomacular adhesion (Mild focal ERM superior macula -- stable).   Notes  *Images captured and stored on drive  Diagnosis / Impression:  ERM with pucker OU (OD>OS) OD: stable ERM with pucker -- interval improvement in foveal contour OS: Mild focal ERM superior macula -- stable  Clinical management:  See below  Abbreviations: NFP - Normal foveal profile. CME - cystoid macular edema. PED - pigment epithelial detachment. IRF - intraretinal fluid. SRF - subretinal fluid. EZ - ellipsoid zone. ERM - epiretinal membrane. ORA - outer retinal atrophy. ORT - outer retinal tubulation. SRHM - subretinal hyper-reflective material            ASSESSMENT/PLAN:    ICD-10-CM   1. Epiretinal membrane (ERM) of both eyes  H35.373 OCT, Retina - OU - Both Eyes    2. Combined forms of age-related cataract of left eye  H25.812     3. Pseudophakia  Z96.1     4. Combined forms of age-related cataract of both eyes  H25.813      1,2. Epiretinal membrane, OU (OD>OS)  - mild ERM OU -- stable  -  BCVA 20/20 OU  - OCT shows OD: stable ERM with pucker -- interval improvement in foveal contour; OS: Mild focal ERM superior macula -- stable  - no metamorphopsia  - no indication for surgery  - recommend continued monitoring  - f/u 1 year, DFE, OCT  3. Mixed form cataracts OS - The symptoms of  cataract, surgical options, and treatments and risks were discussed with patient. - discussed diagnosis and progression - under the expert management of Dr. Ambrosio Junker  4. Pseudophakia OD  - s/p CE/IOL OD (Dr. Ambrosio Junker, January 2022)  - IOL in good position, doing well - monitor  Ophthalmic Meds Ordered this visit:  No orders of the defined types were placed in this encounter.    Return in about 1 year (around 09/20/2024) for f/u ERM OU, DFE, OCT.  There are no Patient Instructions on file for this visit.  This document serves as a record of services personally performed by Jeanice Millard, MD, PhD. It was created on their behalf by Morley Arabia. Bevin Bucks, OA an ophthalmic technician. The creation of this record is the provider's dictation and/or activities during the visit.    Electronically signed by: Morley Arabia. Bevin Bucks, OA 09/23/23 10:33 PM  Jeanice Millard, M.D., Ph.D. Diseases & Surgery of the Retina and Vitreous [facility]  I have reviewed the above documentation for accuracy and completeness, and I agree with the above. Jeanice Millard, M.D., Ph.D. 09/23/23 10:34 PM   Abbreviations: M myopia (nearsighted); A astigmatism; H hyperopia (farsighted); P presbyopia; Mrx spectacle prescription;  CTL contact lenses; OD right eye; OS left eye; OU both eyes  XT exotropia; ET esotropia; PEK punctate epithelial keratitis; PEE punctate epithelial erosions; DES dry eye syndrome; MGD meibomian gland dysfunction; ATs artificial tears; PFAT's preservative free artificial tears; NSC nuclear sclerotic cataract; PSC posterior subcapsular cataract; ERM epi-retinal membrane; PVD posterior vitreous detachment; RD retinal detachment; DM diabetes mellitus; DR diabetic retinopathy; NPDR non-proliferative diabetic retinopathy; PDR proliferative diabetic retinopathy; CSME clinically significant macular edema; DME diabetic macular edema; dbh dot blot hemorrhages; CWS cotton wool spot; POAG primary open  angle glaucoma; C/D cup-to-disc ratio; HVF humphrey visual field; GVF goldmann visual field; OCT optical coherence tomography; IOP intraocular pressure; BRVO Branch retinal vein occlusion; CRVO central retinal vein occlusion; CRAO central retinal artery occlusion; BRAO branch retinal artery occlusion; RT retinal tear; SB scleral buckle; PPV pars plana vitrectomy; VH Vitreous hemorrhage; PRP panretinal laser photocoagulation; IVK intravitreal kenalog; VMT vitreomacular traction; MH Macular hole;  NVD neovascularization of the disc; NVE neovascularization elsewhere; AREDS age related eye disease study; ARMD age related macular degeneration; POAG primary open angle glaucoma; EBMD epithelial/anterior basement membrane dystrophy; ACIOL anterior chamber intraocular lens; IOL intraocular lens; PCIOL posterior chamber intraocular lens; Phaco/IOL phacoemulsification with intraocular lens placement; PRK photorefractive keratectomy; LASIK laser assisted in situ keratomileusis; HTN hypertension; DM diabetes mellitus; COPD chronic obstructive pulmonary disease

## 2023-10-05 DIAGNOSIS — E785 Hyperlipidemia, unspecified: Secondary | ICD-10-CM | POA: Diagnosis not present

## 2023-10-05 DIAGNOSIS — Z8249 Family history of ischemic heart disease and other diseases of the circulatory system: Secondary | ICD-10-CM | POA: Diagnosis not present

## 2023-10-05 DIAGNOSIS — M359 Systemic involvement of connective tissue, unspecified: Secondary | ICD-10-CM | POA: Diagnosis not present

## 2023-10-05 DIAGNOSIS — M810 Age-related osteoporosis without current pathological fracture: Secondary | ICD-10-CM | POA: Diagnosis not present

## 2023-10-05 DIAGNOSIS — Z79890 Hormone replacement therapy (postmenopausal): Secondary | ICD-10-CM | POA: Diagnosis not present

## 2023-10-05 DIAGNOSIS — N952 Postmenopausal atrophic vaginitis: Secondary | ICD-10-CM | POA: Diagnosis not present

## 2023-10-05 DIAGNOSIS — H269 Unspecified cataract: Secondary | ICD-10-CM | POA: Diagnosis not present

## 2023-10-05 DIAGNOSIS — Z809 Family history of malignant neoplasm, unspecified: Secondary | ICD-10-CM | POA: Diagnosis not present

## 2023-10-12 ENCOUNTER — Ambulatory Visit (HOSPITAL_COMMUNITY): Payer: Self-pay | Admitting: Cardiology

## 2023-10-12 ENCOUNTER — Ambulatory Visit (HOSPITAL_COMMUNITY)
Admission: RE | Admit: 2023-10-12 | Discharge: 2023-10-12 | Disposition: A | Discharge status: Home / Self Care | Source: Ambulatory Visit | Attending: Internal Medicine | Admitting: Internal Medicine

## 2023-10-12 DIAGNOSIS — R0600 Dyspnea, unspecified: Secondary | ICD-10-CM | POA: Diagnosis not present

## 2023-10-12 DIAGNOSIS — I272 Pulmonary hypertension, unspecified: Secondary | ICD-10-CM | POA: Insufficient documentation

## 2023-10-12 DIAGNOSIS — Z006 Encounter for examination for normal comparison and control in clinical research program: Secondary | ICD-10-CM

## 2023-10-12 LAB — ECHOCARDIOGRAM COMPLETE
AR max vel: 3.62 cm2
AV Peak grad: 6.6 mmHg
Ao pk vel: 1.28 m/s
Area-P 1/2: 3.07 cm2
Calc EF: 60.4 %
MV VTI: 3.8 cm2
S' Lateral: 2.9 cm
Single Plane A2C EF: 65.1 %
Single Plane A4C EF: 59.5 %

## 2023-10-12 NOTE — Research (Signed)
 SITE: [id number]     Subject # [id number]    Subprotocol: A  Inclusion Criteria  Patients who meet all of the following criteria are eligible for enrollment as study participants:  Yes No  Age > 66 years old X   Eligible to wear Holter Study X    Exclusion Criteria  Patients who meet any of these criteria are not eligible for enrollment as study participants: Yes No  1. Receiving any mechanical (respiratory or circulatory) or renal support therapy at Screening or during Visit #1.  X  2.  Any other conditions that in the opinion of the investigators are likely to prevent compliance with the study protocol or pose a safety concern if the subject participates in the study.  X  3. Poor tolerance, namely susceptible to severe skin allergies from ECG adhesive patch application.  X   Protocol: REV H    60 minute start window         Cor device must be applied, and the study initiated, no later than 60 minutes of completing the Echocardiogram                             HH:MM  Echo completion time  15:30  2.   Cor Study start time  15:42   30-Minute execution window  Once Cor Monitoring begins, 3 QT Med ECGs and the 15-minute rest period must be completed within a 30 minute window     HH:MM  3. QT Med ECG Completion time  15:46  4. Start of 15-Min sitting rest period  15:47  5. End of 15-Min rest period  16:02  6. Time of device removal  16:13   *Continue to use the Mobile App Event feature to log the Rest period windows and follow instructions on the EF-ACT Clinical Trial  Patient Instruction Card.  Describe any anomalies in Protocol execution in the Protocol Deviation Log    Residential Zip code [address] (First 3 digits ONLY)                                           [facility] Informed Consent   Subject Name: [name]  Subject met inclusion and exclusion criteria.  The informed consent form, study requirements and expectations were reviewed with the subject. Subject had  opportunity to read consent and questions and concerns were addressed prior to the signing of the consent form.  The subject verbalized understanding of the trial requirements.  The subject agreed to participate in the [facility] EF ACT trial and signed the informed consent at 15:38 on 12-Oct-2023.  The informed consent was obtained prior to performance of any protocol-specific procedures for the subject.  A copy of the signed informed consent was given to the subject and a copy was placed in the subject's medical record.   [name]          Current Outpatient Medications:    Acetaminophen  (TYLENOL  DISSOLVE PACKS) 500 MG PACK, Take 500-1,000 mg by mouth every 6 (six) hours as needed (pain)., Disp: , Rfl:    antiseptic oral rinse (BIOTENE) LIQD, 15 mLs by Mouth Rinse route as needed for dry mouth. (Patient not taking: Reported on 09/06/2023), Disp: , Rfl:    atorvastatin (LIPITOR) 10 MG tablet, Take 10 mg by mouth at bedtime., Disp: , Rfl:  bisacodyl 5 MG EC tablet, Take 5-15 mg by mouth daily as needed for moderate constipation., Disp: , Rfl:    Calcium Carbonate-Vitamin D (CALCIUM-D PO), Take 2 tablets by mouth daily., Disp: , Rfl:    Cyanocobalamin (B-12) 5000 MCG CAPS, Take 5,000 mcg by mouth daily., Disp: , Rfl:    estradiol (CLIMARA - DOSED IN MG/24 HR) 0.0375 mg/24hr patch, Place 0.0375 mg onto the skin every Monday., Disp: , Rfl:    Multiple Vitamins-Minerals (ADULT GUMMY PO), Take 2 capsules by mouth daily., Disp: , Rfl:    naproxen sodium (ALEVE) 220 MG tablet, Take 220 mg by mouth daily as needed (pain)., Disp: , Rfl:    omeprazole (PRILOSEC) 20 MG capsule, Take 20 mg by mouth daily as needed (acid reflux)., Disp: , Rfl:    polyethylene glycol (MIRALAX  / GLYCOLAX ) 17 g packet, Take 8.5-17 g by mouth daily as needed for moderate constipation., Disp: , Rfl:    Probiotic CHEW, Chew 2 capsules by mouth daily., Disp: , Rfl:    Propylene Glycol (SYSTANE COMPLETE) 0.6 % SOLN, Place 1  drop into both eyes 4 (four) times daily., Disp: , Rfl:    pseudoephedrine  (SUDAFED) 30 MG tablet, Take 60 mg by mouth every 6 (six) hours as needed for congestion., Disp: , Rfl:    simethicone  (MYLICON) 125 MG chewable tablet, Chew 125 mg by mouth every 6 (six) hours as needed for flatulence., Disp: , Rfl:    sodium chloride  (OCEAN) 0.65 % SOLN nasal spray, Place 1 spray into both nostrils as needed for congestion., Disp: , Rfl:    vitamin E 180 MG (400 UNITS) capsule, Take 400 Units by mouth 2 (two) times daily., Disp: , Rfl:

## 2023-11-14 DIAGNOSIS — M810 Age-related osteoporosis without current pathological fracture: Secondary | ICD-10-CM | POA: Diagnosis not present

## 2023-11-14 DIAGNOSIS — E7800 Pure hypercholesterolemia, unspecified: Secondary | ICD-10-CM | POA: Diagnosis not present

## 2023-12-07 DIAGNOSIS — E7800 Pure hypercholesterolemia, unspecified: Secondary | ICD-10-CM | POA: Diagnosis not present

## 2023-12-07 DIAGNOSIS — E559 Vitamin D deficiency, unspecified: Secondary | ICD-10-CM | POA: Diagnosis not present

## 2023-12-15 DIAGNOSIS — M810 Age-related osteoporosis without current pathological fracture: Secondary | ICD-10-CM | POA: Diagnosis not present

## 2023-12-15 DIAGNOSIS — E7800 Pure hypercholesterolemia, unspecified: Secondary | ICD-10-CM | POA: Diagnosis not present

## 2024-01-04 DIAGNOSIS — M3509 Sicca syndrome with other organ involvement: Secondary | ICD-10-CM | POA: Diagnosis not present

## 2024-01-04 DIAGNOSIS — I7300 Raynaud's syndrome without gangrene: Secondary | ICD-10-CM | POA: Diagnosis not present

## 2024-01-04 DIAGNOSIS — Z6824 Body mass index (BMI) 24.0-24.9, adult: Secondary | ICD-10-CM | POA: Diagnosis not present

## 2024-01-04 DIAGNOSIS — H04123 Dry eye syndrome of bilateral lacrimal glands: Secondary | ICD-10-CM | POA: Diagnosis not present

## 2024-01-04 DIAGNOSIS — R682 Dry mouth, unspecified: Secondary | ICD-10-CM | POA: Diagnosis not present

## 2024-01-04 DIAGNOSIS — M359 Systemic involvement of connective tissue, unspecified: Secondary | ICD-10-CM | POA: Diagnosis not present

## 2024-01-04 DIAGNOSIS — R0600 Dyspnea, unspecified: Secondary | ICD-10-CM | POA: Diagnosis not present

## 2024-01-15 DIAGNOSIS — E7800 Pure hypercholesterolemia, unspecified: Secondary | ICD-10-CM | POA: Diagnosis not present

## 2024-01-15 DIAGNOSIS — M810 Age-related osteoporosis without current pathological fracture: Secondary | ICD-10-CM | POA: Diagnosis not present

## 2024-02-07 DIAGNOSIS — K1121 Acute sialoadenitis: Secondary | ICD-10-CM | POA: Diagnosis not present

## 2024-02-08 DIAGNOSIS — K1121 Acute sialoadenitis: Secondary | ICD-10-CM | POA: Diagnosis not present

## 2024-02-09 DIAGNOSIS — K1121 Acute sialoadenitis: Secondary | ICD-10-CM | POA: Diagnosis not present

## 2024-02-14 DIAGNOSIS — M810 Age-related osteoporosis without current pathological fracture: Secondary | ICD-10-CM | POA: Diagnosis not present

## 2024-02-14 DIAGNOSIS — E7800 Pure hypercholesterolemia, unspecified: Secondary | ICD-10-CM | POA: Diagnosis not present

## 2024-02-14 DIAGNOSIS — K1121 Acute sialoadenitis: Secondary | ICD-10-CM | POA: Diagnosis not present

## 2024-02-16 ENCOUNTER — Encounter (HOSPITAL_COMMUNITY): Payer: Self-pay | Admitting: Cardiology

## 2024-02-16 ENCOUNTER — Ambulatory Visit (HOSPITAL_COMMUNITY)
Admission: RE | Admit: 2024-02-16 | Discharge: 2024-02-16 | Disposition: A | Discharge status: Home / Self Care | Source: Ambulatory Visit | Attending: Cardiology | Admitting: Cardiology

## 2024-02-16 VITALS — BP 130/70 | HR 81 | Wt 142.4 lb

## 2024-02-16 DIAGNOSIS — H04129 Dry eye syndrome of unspecified lacrimal gland: Secondary | ICD-10-CM | POA: Diagnosis not present

## 2024-02-16 DIAGNOSIS — R0609 Other forms of dyspnea: Secondary | ICD-10-CM | POA: Diagnosis not present

## 2024-02-16 DIAGNOSIS — Z79899 Other long term (current) drug therapy: Secondary | ICD-10-CM | POA: Diagnosis not present

## 2024-02-16 DIAGNOSIS — I7300 Raynaud's syndrome without gangrene: Secondary | ICD-10-CM | POA: Diagnosis not present

## 2024-02-16 DIAGNOSIS — M351 Other overlap syndromes: Secondary | ICD-10-CM | POA: Diagnosis not present

## 2024-02-16 NOTE — Patient Instructions (Signed)
   Follow-Up in: 6 months with Dr. Rolan PLEASE CALL OUR OFFICE AROUND FEBRUARY  TO GET SCHEDULED FOR YOUR APPOINTMENT. PHONE NUMBER IS [phone] OPTION 2   At the [facility], you and your health needs are our priority. We have a designated team specialized in the treatment of Heart Failure. This Care Team includes your primary Heart Failure Specialized Cardiologist (physician), Advanced Practice Providers (APPs- Physician Assistants and Nurse Practitioners), and Pharmacist who all work together to provide you with the care you need, when you need it.   You may see any of the following providers on your designated Care Team at your next follow up:  Dr. Toribio Fuel Dr. Ezra Rolan Dr. Ria Commander Dr. Odis Brownie Greig Mosses, NP [place]affie Shed, [place] [facility] [place], [place] Beckey Coe, NP [place] Lee, NP Tinnie Redman, PharmD   Please be sure to bring in all your medications bottles to every appointment.   Need to Contact Us :  If you have any questions or concerns before your next appointment please send us  a message through [facility] or call our office at [phone].    TO LEAVE A MESSAGE FOR THE NURSE SELECT OPTION 2, PLEASE LEAVE A MESSAGE INCLUDING: YOUR NAME DATE OF BIRTH CALL BACK NUMBER REASON FOR CALL**this is important as we prioritize the call backs  YOU WILL RECEIVE A CALL BACK THE SAME DAY AS LONG AS YOU CALL BEFORE 4:00 PM

## 2024-02-16 NOTE — Progress Notes (Signed)
 [facility]IN[facility] NOTE   PCP: Dwight Trula SQUIBB, MD Rheumatologist: Dr. Alverda HF Cardiology: Dr. Rolan  68 y.o. 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  Patient has history of dry eyes/mouth and Raynaud's phenomenon.  She was referred to a rheumatologist who thinks that she may have MCTD.  She has no history of cardiac problems. Since the summer of 2023, she has noted exertional dyspnea.    Echo 12/23 EF 60-65%, normal RV, normal IVC, TR inadequate to assess PA pressure.  PFTs were done and were noted to be normal including DLCO.   She saw Dr Rolan and was set up for RHC. 2024 RHC with normal filling pressures and normal PA pressure.   Echo 5/25 with EF 60-65%, no RWMA, RV hyperdynamic  Today she returns for HF follow up. Overall feeling good. Denies palpitations, CP, dizziness, edema, or PND/Orthopnea. Ongoing SOB with prologed exertion, resolves with rest. Appetite ok.  Taking all medications. Denies ETOH, tobacco or drug use. SBP at home ~118.    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  2. Hyperlipidemia.  3. GERD: Hiatal hernia.  4. Dyspnea: Echo (12/23) with EF 60-65%, normal RV, normal IVC, TR inadequate to assess PA pressure.   - PFTs (12/23): normal including DLCO. - PFTs (07/2023):  normal - Echo 5/25 with EF 60-65%, no RWMA, RV hyperdynamic 5. RHC - 2024 normal filling pressure, CO 6.3, CI 3.7.   Social History   Socioeconomic History   Marital status: Single    Spouse name: Not on file   Number of children: Not on file   Years of education: Not on file   Highest education level: Not on file  Occupational History   Not on file  Tobacco Use   Smoking status: Never   Smokeless tobacco: Never  Vaping Use   Vaping status: Never Used  Substance and Sexual Activity   Alcohol  use: No   Drug use: No   Sexual activity: Not on file  Other Topics Concern   Not on file  Social History  Narrative   Not on file   Social Drivers of Health   Financial Resource Strain: Not on file  Food Insecurity: Not on file  Transportation Needs: Not on file  Physical Activity: Not on file  Stress: Not on file  Social Connections: Not on file  Intimate Partner Violence: Not on file   Family History  Problem Relation Age of Onset   Hypertension Mother    Hypertension Father    Hypertension Brother    Breast cancer Paternal Aunt    Diabetes Maternal Grandmother    ROS: All systems reviewed and negative except as per HPI.   Current Outpatient Medications  Medication Sig Dispense Refill   Acetaminophen  (TYLENOL  DISSOLVE PACKS) 500 MG PACK Take 500-1,000 mg by mouth every 6 (six) hours as needed (pain).     amoxicillin-clavulanate (AUGMENTIN) 400-57 MG/5ML suspension Take 400 mg by mouth every 12 (twelve) hours. 10 ml     antiseptic oral rinse (BIOTENE) LIQD 15 mLs by Mouth Rinse route as needed for dry mouth.     atorvastatin (LIPITOR) 10 MG tablet Take 10 mg by mouth at bedtime.     bisacodyl 5 MG EC tablet Take 5-15 mg by mouth daily as needed for moderate constipation.     Calcium Carbonate-Vitamin D (CALCIUM-D PO) Take 2 tablets by mouth daily.     Cyanocobalamin (B-12) 5000 MCG CAPS  Take 5,000 mcg by mouth daily.     estradiol (CLIMARA - DOSED IN MG/24 HR) 0.0375 mg/24hr patch Place 0.0375 mg onto the skin every Monday.     Multiple Vitamins-Minerals (ADULT GUMMY PO) Take 2 capsules by mouth daily.     naproxen sodium (ALEVE) 220 MG tablet Take 220 mg by mouth daily as needed (pain).     omeprazole (PRILOSEC) 20 MG capsule Take 20 mg by mouth daily as needed (acid reflux).     polyethylene glycol (MIRALAX  / GLYCOLAX ) 17 g packet Take 8.5-17 g by mouth daily as needed for moderate constipation.     Probiotic CHEW Chew 2 capsules by mouth daily.     Propylene Glycol (SYSTANE COMPLETE) 0.6 % SOLN Place 1 drop into both eyes 4 (four) times daily.     pseudoephedrine  (SUDAFED) 30  MG tablet Take 60 mg by mouth every 6 (six) hours as needed for congestion.     simethicone  (MYLICON) 125 MG chewable tablet Chew 125 mg by mouth every 6 (six) hours as needed for flatulence.     sodium chloride  (OCEAN) 0.65 % SOLN nasal spray Place 1 spray into both nostrils as needed for congestion.     vitamin E 180 MG (400 UNITS) capsule Take 400 Units by mouth 2 (two) times daily.     No current facility-administered medications for this encounter.   BP 130/70   Pulse 81   Wt 64.6 kg (142 lb 6.4 oz)   SpO2 99%   BMI 25.23 kg/m  General:  well appearing.  No respiratory difficulty. Walked into clinic.  Neck: JVD flat.  Cor: Regular rate & rhythm. No murmurs. Lungs: clear Extremities: no edema  Neuro: alert & oriented x 3. Affect pleasant.   EKG: NSR 78 bpm, possible LAE (Personally reviewed)     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  2. Exertional dyspnea: Slowly progressive.  PFTs were normal including DLCO in 12/23.  Echo in 12/23 showed EF 60-65%, normal RV, normal IVC, TR inadequate to assess PA pressure.  ECG normal.  With mixed connective tissue disorder, interstitial lung disease and pulmonary hypertension are considerations.  PFTs show no evidence for ILD.  Echo showed normal LV and RV function, but unable to estimate PA pressure. She is not volume overloaded on exam.  - RHC was negative for pulmonary hypertension.   - Repeat echo 5/25 with EF 60-65%, no RWMA, RV hyperdynamic - Still with intt SOB but it quickly resolves with rest. Euvolemic on exam.   Follow up with Dr Rolan in 6 months.    Beckey LITTIE Coe AGACNP-BC  02/16/2024

## 2024-02-27 DIAGNOSIS — K1121 Acute sialoadenitis: Secondary | ICD-10-CM | POA: Diagnosis not present

## 2024-03-16 DIAGNOSIS — E7800 Pure hypercholesterolemia, unspecified: Secondary | ICD-10-CM | POA: Diagnosis not present

## 2024-03-16 DIAGNOSIS — M810 Age-related osteoporosis without current pathological fracture: Secondary | ICD-10-CM | POA: Diagnosis not present

## 2024-03-21 DIAGNOSIS — K1121 Acute sialoadenitis: Secondary | ICD-10-CM | POA: Diagnosis not present

## 2024-04-15 DIAGNOSIS — M810 Age-related osteoporosis without current pathological fracture: Secondary | ICD-10-CM | POA: Diagnosis not present

## 2024-04-15 DIAGNOSIS — E7800 Pure hypercholesterolemia, unspecified: Secondary | ICD-10-CM | POA: Diagnosis not present

## 2024-05-08 ENCOUNTER — Other Ambulatory Visit: Payer: Self-pay | Admitting: Internal Medicine

## 2024-05-08 DIAGNOSIS — Z1231 Encounter for screening mammogram for malignant neoplasm of breast: Secondary | ICD-10-CM

## 2024-08-14 ENCOUNTER — Ambulatory Visit

## 2024-08-22 ENCOUNTER — Ambulatory Visit (HOSPITAL_COMMUNITY): Admitting: Cardiology

## 2024-09-21 ENCOUNTER — Encounter (INDEPENDENT_AMBULATORY_CARE_PROVIDER_SITE_OTHER): Admitting: Ophthalmology
